# Patient Record
Sex: Male | Born: 1995 | Race: Black or African American | Hispanic: No | Marital: Single | Smoking: Never smoker
Health system: Southern US, Community
[De-identification: ages and names within clinical notes are randomized; demographics above are authoritative.]

## PROBLEM LIST (undated history)

## (undated) DIAGNOSIS — K5900 Constipation, unspecified: Secondary | ICD-10-CM

## (undated) DIAGNOSIS — J45909 Unspecified asthma, uncomplicated: Secondary | ICD-10-CM

## (undated) DIAGNOSIS — L240 Irritant contact dermatitis due to detergents: Secondary | ICD-10-CM

## (undated) DIAGNOSIS — F5001 Anorexia nervosa, restricting type: Secondary | ICD-10-CM

## (undated) DIAGNOSIS — D709 Neutropenia, unspecified: Secondary | ICD-10-CM

## (undated) DIAGNOSIS — T7840XA Allergy, unspecified, initial encounter: Secondary | ICD-10-CM

## (undated) DIAGNOSIS — L309 Dermatitis, unspecified: Secondary | ICD-10-CM

## (undated) DIAGNOSIS — E559 Vitamin D deficiency, unspecified: Secondary | ICD-10-CM

## (undated) DIAGNOSIS — L709 Acne, unspecified: Secondary | ICD-10-CM

## (undated) HISTORY — DX: Allergy, unspecified, initial encounter: T78.40XA

## (undated) HISTORY — PX: TONSILLECTOMY AND ADENOIDECTOMY: SUR1326

## (undated) HISTORY — DX: Vitamin D deficiency, unspecified: E55.9

## (undated) HISTORY — DX: Unspecified asthma, uncomplicated: J45.909

## (undated) HISTORY — DX: Anorexia nervosa, restricting type: F50.01

## (undated) HISTORY — DX: Constipation, unspecified: K59.00

## (undated) HISTORY — DX: Neutropenia, unspecified: D70.9

## (undated) HISTORY — DX: Irritant contact dermatitis due to detergents: L24.0

## (undated) HISTORY — DX: Acne, unspecified: L70.9

## (undated) HISTORY — DX: Dermatitis, unspecified: L30.9

---
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
## 2015-05-25 ENCOUNTER — Telehealth: Payer: Self-pay | Admitting: Family Medicine

## 2015-05-25 NOTE — Telephone Encounter (Signed)
Pt needs refill on hisTriamcinolone 0.5% Cream. Pt uses the [facility] [address]

## 2015-05-26 ENCOUNTER — Other Ambulatory Visit: Payer: Self-pay | Admitting: Family Medicine

## 2015-06-28 ENCOUNTER — Ambulatory Visit (INDEPENDENT_AMBULATORY_CARE_PROVIDER_SITE_OTHER): Payer: Medicaid Other | Admitting: Family Medicine

## 2015-06-28 ENCOUNTER — Encounter: Payer: Self-pay | Admitting: Family Medicine

## 2015-06-28 ENCOUNTER — Other Ambulatory Visit: Payer: Self-pay | Admitting: Family Medicine

## 2015-06-28 VITALS — BP 102/64 | HR 93 | Temp 98.3°F | Resp 14 | Ht 71.0 in | Wt 140.0 lb

## 2015-06-28 DIAGNOSIS — R17 Unspecified jaundice: Secondary | ICD-10-CM

## 2015-06-28 DIAGNOSIS — K5900 Constipation, unspecified: Secondary | ICD-10-CM | POA: Insufficient documentation

## 2015-06-28 DIAGNOSIS — L309 Dermatitis, unspecified: Secondary | ICD-10-CM | POA: Insufficient documentation

## 2015-06-28 DIAGNOSIS — E559 Vitamin D deficiency, unspecified: Secondary | ICD-10-CM | POA: Insufficient documentation

## 2015-06-28 DIAGNOSIS — F5001 Anorexia nervosa, restricting type: Secondary | ICD-10-CM | POA: Insufficient documentation

## 2015-06-28 DIAGNOSIS — J302 Other seasonal allergic rhinitis: Secondary | ICD-10-CM | POA: Insufficient documentation

## 2015-06-28 DIAGNOSIS — H1011 Acute atopic conjunctivitis, right eye: Secondary | ICD-10-CM

## 2015-06-28 DIAGNOSIS — Z91010 Allergy to peanuts: Secondary | ICD-10-CM | POA: Insufficient documentation

## 2015-06-28 DIAGNOSIS — H1010 Acute atopic conjunctivitis, unspecified eye: Secondary | ICD-10-CM | POA: Insufficient documentation

## 2015-06-28 DIAGNOSIS — L239 Allergic contact dermatitis, unspecified cause: Secondary | ICD-10-CM | POA: Insufficient documentation

## 2015-06-28 DIAGNOSIS — L709 Acne, unspecified: Secondary | ICD-10-CM | POA: Insufficient documentation

## 2015-06-28 DIAGNOSIS — J3089 Other allergic rhinitis: Secondary | ICD-10-CM

## 2015-06-28 DIAGNOSIS — J4540 Moderate persistent asthma, uncomplicated: Secondary | ICD-10-CM | POA: Insufficient documentation

## 2015-06-28 DIAGNOSIS — J309 Allergic rhinitis, unspecified: Secondary | ICD-10-CM | POA: Insufficient documentation

## 2015-06-28 NOTE — Progress Notes (Signed)
Name: Ricardo Ramos   MRN: [id number]    DOB: 1996/09/11   Date:06/28/2015       Progress Note  Subjective  Chief Complaint  Chief Complaint  Patient presents with  . Allergic Rhinitis     white part of eye yellow, crusted in the morning onset 1 day    HPI  Patient is here today accompanied by his grandmother with concerns regarding the following symptoms itchy red right eye, swelling, discharge, pain of the right eye. No fevers, vomiting, headaches or visual changes. Started taking his claritin which has significantly improved his symptoms. Although he does not some yellowing of the whites of his eyes. Denies abdominal pain, change to the color of his stools, fevers, chills, headaches, nausea, illicit drug use.    Past Medical History  Diagnosis Date  . Allergy   . Asthma     History  Substance Use Topics  . Smoking status: Never Smoker   . Smokeless tobacco: Not on file  . Alcohol Use: No     Current outpatient prescriptions:  .  albuterol (PROAIR HFA) 108 (90 BASE) MCG/ACT inhaler, Inhale 1-2 puffs into the lungs every 6 (six) hours as needed., Disp: , Rfl:  .  beclomethasone (QVAR) 40 MCG/ACT inhaler, Inhale 2 puffs into the lungs 2 (two) times daily., Disp: , Rfl:  .  EPINEPHrine (EPIPEN 2-PAK) 0.3 mg/0.3 mL IJ SOAJ injection, EPIPEN 2-PAK, 0.3MG /0.3ML (Injection Solution Auto-injector)  1 Device use as directed for 30 days  Quantity: 1;  Refills: 1   Ordered :14-Aug-2014  Alba Cory MD;  Started 14-Aug-2014 Active, Disp: , Rfl:  .  loratadine (CLARITIN) 10 MG tablet, Take 10 mg by mouth daily., Disp: , Rfl:  .  triamcinolone cream (KENALOG) 0.5 %, APPLY TO AFFECTED AREA TWICE A DAY, Disp: 75 g, Rfl: 0 .  Vitamin D, Ergocalciferol, (DRISDOL) 50000 UNITS CAPS capsule, Take 50,000 Units by mouth every 7 (seven) days., Disp: , Rfl:   Allergies  Allergen Reactions  . Advair Diskus  [Fluticasone-Salmeterol]   . Eggs Or Egg-Derived Products   . Fish Allergy   .  Peanut Oil   . Peanut-Containing Drug Products   . Penicillins   . Shellfish Allergy   . Strawberry   . Wheat Bran     ROS  10 Systems reviewed and is negative except as mentioned in HPI.   Objective  Filed Vitals:   06/28/15 1416  BP: 102/64  Pulse: 93  Temp: 98.3 F (36.8 C)  TempSrc: Oral  Resp: 14  Height:  (1.803 m)  Weight: 140 lb (63.504 kg)  SpO2: 98%   Body mass index is 19.53 kg/(m^2).   Physical Exam  Constitutional: Patient appears well-developed and well-nourished. In no distress.  HEENT:  - Head: Normocephalic and atraumatic.  - Ears: Bilateral TMs gray, no erythema or effusion - Nose: Nasal mucosa moist. - Mouth/Throat: Oropharynx is clear and moist. No tonsillar hypertrophy or erythema. No post nasal drainage.  - Eyes: Right and left eye scleral icterus mild with a dull yellow discharge on eyelashes on right side only present. No erythema or corneal congestion. EOM movements normal. PERRLA.  Neck: Normal range of motion. Neck supple. No JVD present. No thyromegaly present.  Cardiovascular: Normal rate, regular rhythm and normal heart sounds.  No murmur heard.  Pulmonary/Chest: Effort normal and breath sounds normal. No respiratory distress. Musculoskeletal: Normal range of motion bilateral UE and LE, no joint effusions. Peripheral vascular: Bilateral LE no edema.  Neurological: CN II-XII grossly intact with no focal deficits. Alert and oriented to person, place, and time. Coordination, balance, strength, speech and gait are normal.  Skin: Skin is warm and dry. No rash noted. No erythema.  Psychiatric: Patient has a stable mood and flat affect. Behavior is normal in office today. Judgment and thought content normal in office today.   Assessment & Plan 1. Allergic rhinitis, seasonal Continue claritin daily.  2. Allergic conjunctivitis and rhinitis, right Nearly resolved, may use warm compressor to wipe away any further discharge. [place] OTC  allergy eye drop recommended.   3. Scleral icterus Slight yellowing of his eyes will get lab work.  - Comprehensive metabolic panel - Hepatitis A antibody, total - Hepatitis B surface antibody - Hepatitis C antibody - HIV antibody

## 2015-06-28 NOTE — Patient Instructions (Signed)
Allergic Conjunctivitis  The conjunctiva is a thin membrane that covers the visible white part of the eyeball and the underside of the eyelids. This membrane protects and lubricates the eye. The membrane has small blood vessels running through it that can normally be seen. When the conjunctiva becomes inflamed, the condition is called conjunctivitis. In response to the inflammation, the conjunctival blood vessels become swollen. The swelling results in redness in the normally white part of the eye.  The blood vessels of this membrane also react when a person has allergies and is then called allergic conjunctivitis. This condition usually lasts for as long as the allergy persists. Allergic conjunctivitis cannot be passed to another person (non-contagious). The likelihood of bacterial infection is great and the cause is not likely due to allergies if the inflamed eye has:  · A sticky discharge.  · Discharge or sticking together of the lids in the morning.  · Scaling or flaking of the eyelids where the eyelashes come out.  · Red swollen eyelids.  CAUSES   · Viruses.  · Irritants such as foreign bodies.  · Chemicals.  · General allergic reactions.  · Inflammation or serious diseases in the inside or the outside of the eye or the orbit (the boney cavity in which the eye sits) can cause a "red eye."  SYMPTOMS   · Eye redness.  · Tearing.  · Itchy eyes.  · Burning feeling in the eyes.  · Clear drainage from the eye.  · Allergic reaction due to pollens or ragweed sensitivity. Seasonal allergic conjunctivitis is frequent in the spring when pollens are in the air and in the fall.  DIAGNOSIS   This condition, in its many forms, is usually diagnosed based on the history and an ophthalmological exam. It usually involves both eyes. If your eyes react at the same time every year, allergies may be the cause. While most "red eyes" are due to allergy or an infection, the role of an eye (ophthalmological) exam is important. The exam  can rule out serious diseases of the eye or orbit.  TREATMENT   · Non-antibiotic eye drops, ointments, or medications by mouth may be prescribed if the ophthalmologist is sure the conjunctivitis is due to allergies alone.  · Over-the-counter drops and ointments for allergic symptoms should be used only after other causes of conjunctivitis have been ruled out, or as your caregiver suggests.  Medications by mouth are often prescribed if other allergy-related symptoms are present. If the ophthalmologist is sure that the conjunctivitis is due to allergies alone, treatment is normally limited to drops or ointments to reduce itching and burning.  HOME CARE INSTRUCTIONS   · Wash hands before and after applying drops or ointments, or touching the inflamed eye(s) or eyelids.  · Do not let the eye dropper tip or ointment tube touch the eyelid when putting medicine in your eye.  · Stop using your soft contact lenses and throw them away. Use a new pair of lenses when recovery is complete. You should run through sterilizing cycles at least three times before use after complete recovery if the old soft contact lenses are to be used. Hard contact lenses should be stopped. They need to be thoroughly sterilized before use after recovery.  · Itching and burning eyes due to allergies is often relieved by using a cool cloth applied to closed eye(s).  SEEK MEDICAL CARE IF:   · Your problems do not go away after two or three days of treatment.  ·   Your lids are sticky (especially in the morning when you wake up) or stick together.  · Discharge develops. Antibiotics may be needed either as drops, ointment, or by mouth.  · You have extreme light sensitivity.  · An oral temperature above 102° F (38.9° C) develops.  · Pain in or around the eye or any other visual symptom develops.  MAKE SURE YOU:   · Understand these instructions.  · Will watch your condition.  · Will get help right away if you are not doing well or get worse.  Document  Released: 02/03/2003 Document Revised: 02/05/2012 Document Reviewed: 12/30/2007  [employer]® Patient Information ©2015 [employer]. This information is not intended to replace advice given to you by your health care provider. Make sure you discuss any questions you have with your health care provider.

## 2015-06-29 LAB — COMPREHENSIVE METABOLIC PANEL
ALBUMIN: 4.8 g/dL (ref 3.5–5.5)
ALK PHOS: 92 IU/L (ref 39–117)
ALT: 9 IU/L (ref 0–44)
AST: 18 IU/L (ref 0–40)
Albumin/Globulin Ratio: 1.8 (ref 1.1–2.5)
BUN/Creatinine Ratio: 10 (ref 8–19)
BUN: 15 mg/dL (ref 6–20)
Bilirubin Total: 4.8 mg/dL — ABNORMAL HIGH (ref 0.0–1.2)
CALCIUM: 9.5 mg/dL (ref 8.7–10.2)
CHLORIDE: 101 mmol/L (ref 97–108)
CO2: 28 mmol/L (ref 18–29)
Creatinine, Ser: 1.5 mg/dL — ABNORMAL HIGH (ref 0.76–1.27)
GFR calc Af Amer: 77 mL/min/{1.73_m2} (ref >59)
GFR, EST NON AFRICAN AMERICAN: 67 mL/min/{1.73_m2} (ref >59)
Globulin, Total: 2.6 g/dL (ref 1.5–4.5)
Glucose: 98 mg/dL (ref 65–99)
POTASSIUM: 4.4 mmol/L (ref 3.5–5.2)
SODIUM: 144 mmol/L (ref 134–144)
TOTAL PROTEIN: 7.4 g/dL (ref 6.0–8.5)

## 2015-06-29 LAB — HIV ANTIBODY (ROUTINE TESTING W REFLEX): HIV SCREEN 4TH GENERATION: NONREACTIVE

## 2015-06-29 LAB — HEPATITIS B SURFACE ANTIBODY,QUALITATIVE: HEP B SURFACE AB, QUAL: REACTIVE

## 2015-06-29 LAB — HEPATITIS A ANTIBODY, TOTAL: Hep A Total Ab: POSITIVE — AB

## 2015-06-29 LAB — HEPATITIS C ANTIBODY: Hep C Virus Ab: <0.1 s/co ratio (ref 0.0–0.9)

## 2015-06-29 NOTE — Progress Notes (Signed)
I will communicate my concerns about this patient with Dr. Carlynn Purl in person.

## 2015-07-06 ENCOUNTER — Ambulatory Visit (INDEPENDENT_AMBULATORY_CARE_PROVIDER_SITE_OTHER): Payer: Medicaid Other | Admitting: Family Medicine

## 2015-07-06 ENCOUNTER — Encounter: Payer: Self-pay | Admitting: Family Medicine

## 2015-07-06 VITALS — BP 118/74 | HR 112 | Temp 98.5°F | Resp 14 | Ht 71.0 in | Wt 144.2 lb

## 2015-07-06 DIAGNOSIS — Z87898 Personal history of other specified conditions: Secondary | ICD-10-CM

## 2015-07-06 DIAGNOSIS — J4520 Mild intermittent asthma, uncomplicated: Secondary | ICD-10-CM | POA: Diagnosis not present

## 2015-07-06 DIAGNOSIS — E559 Vitamin D deficiency, unspecified: Secondary | ICD-10-CM

## 2015-07-06 DIAGNOSIS — F5001 Anorexia nervosa, restricting type: Secondary | ICD-10-CM | POA: Diagnosis not present

## 2015-07-06 DIAGNOSIS — Z8639 Personal history of other endocrine, nutritional and metabolic disease: Secondary | ICD-10-CM | POA: Diagnosis not present

## 2015-07-06 MED ORDER — ALBUTEROL SULFATE HFA 108 (90 BASE) MCG/ACT IN AERS: 1.0000 | INHALATION_SPRAY | Freq: Four times a day (QID) | RESPIRATORY_TRACT | Status: DC | PRN

## 2015-07-06 NOTE — Progress Notes (Signed)
Name: Ricardo Ramos   MRN: [id number]    DOB: 19-Jun-1996   Date:07/06/2015       Progress Note  Subjective  Chief Complaint  Chief Complaint  Patient presents with  . Follow-up    Abnormal Labs-Recheck    HPI  Jaundice: patient was seen last week because his sclera was yellow, he did not have any nausea, vomiting or change in appetite, no acholic stools. He did not take nay new supplements, no recent travelling. He had some labs done and it showed elevation on bilirubin, and mild increase in Creatinine.   Anorexia Nervosa: he has the restrictive type, no longer having therapy, but weight has been stable, he denies use of laxatives, he states he has not been counting calories lately, but weigh has been stable, in the past used to eat 1200 calories daily. He denies exercising.  Asthma: symptoms have been controlled, no SOB, no wheezing, no cough, no nocturnal wakening , he uses Proair prn with URI. No need to take prednisone or visits to ER/urgent care over the past year.   Patient Active Problem List   Diagnosis Date Noted  . Acne 06/28/2015  . Anorexia nervosa, restricting type 06/28/2015  . CN (constipation) 06/28/2015  . Allergic contact dermatitis 06/28/2015  . Dermatitis, eczematoid 06/28/2015  . Asthma, mild intermittent 06/28/2015  . Allergic to peanuts 06/28/2015  . Allergic rhinitis, seasonal 06/28/2015  . Vitamin D deficiency 06/28/2015  . Allergic conjunctivitis and rhinitis 06/28/2015    Past Surgical History  Procedure Laterality Date  . Tonsillectomy and adenoidectomy      Family History  Problem Relation Age of Onset  . Asthma Sister   . Allergies Sister     Nuts    History   Social History  . Marital Status: Single    Spouse Name: N/A  . Number of Children: N/A  . Years of Education: N/A   Occupational History  . Not on file.   Social History Main Topics  . Smoking status: Never Smoker   . Smokeless tobacco: Never Used  . Alcohol Use: No   . Drug Use: No  . Sexual Activity: Not Currently   Other Topics Concern  . Not on file   Social History Narrative     Current outpatient prescriptions:  .  albuterol (PROAIR HFA) 108 (90 BASE) MCG/ACT inhaler, Inhale 1-2 puffs into the lungs every 6 (six) hours as needed., Disp: 1 Inhaler, Rfl: 0 .  beclomethasone (QVAR) 40 MCG/ACT inhaler, Inhale 2 puffs into the lungs 2 (two) times daily., Disp: , Rfl:  .  EPINEPHrine (EPIPEN 2-PAK) 0.3 mg/0.3 mL IJ SOAJ injection, EPIPEN 2-PAK, 0.3MG /0.3ML (Injection Solution Auto-injector)  1 Device use as directed for 30 days  Quantity: 1;  Refills: 1   Ordered :14-Aug-2014  Alba Cory MD;  Started 14-Aug-2014 Active, Disp: , Rfl:  .  loratadine (CLARITIN) 10 MG tablet, Take 10 mg by mouth daily., Disp: , Rfl:  .  triamcinolone cream (KENALOG) 0.5 %, APPLY TO AFFECTED AREA TWICE A DAY, Disp: 75 g, Rfl: 0 .  Vitamin D, Ergocalciferol, (DRISDOL) 50000 UNITS CAPS capsule, Take 50,000 Units by mouth every 7 (seven) days., Disp: , Rfl:   Allergies  Allergen Reactions  . Advair Diskus  [Fluticasone-Salmeterol]   . Eggs Or Egg-Derived Products   . Fish Allergy   . Peanut Oil   . Peanut-Containing Drug Products   . Penicillins   . Shellfish Allergy   . Strawberry   .  Wheat Bran      ROS  Constitutional: Negative for fever or weight change.  Respiratory: Negative for cough and shortness of breath.   Cardiovascular: Negative for chest pain or palpitations.  Gastrointestinal: Negative for abdominal pain, no bowel changes.  Musculoskeletal: Negative for gait problem or joint swelling.  Skin: Negative for rash.  Neurological: Negative for dizziness or headache.  No other specific complaints in a complete review of systems (except as listed in HPI above).  Objective  Filed Vitals:   07/06/15 1350  BP: 118/74  Pulse: 112  Temp: 98.5 F (36.9 C)  TempSrc: Oral  Resp: 14  Height: 5\' 11"  (1.803 m)  Weight: 144 lb 3.2 oz (65.409 kg)   SpO2: 97%    Body mass index is 20.12 kg/(m^2).  Physical Exam  Constitutional: Patient appears well-developed and well-nourished.  No distress.  HEENT: head atraumatic, normocephalic, pupils equal and reactive to light, mild icterus conjunctiva , neck supple, throat within normal limits Cardiovascular: Normal rate, regular rhythm and normal heart sounds.  No murmur heard. No BLE edema. Pulmonary/Chest: Effort normal and breath sounds normal. No respiratory distress. Abdominal:   There is no tenderness. Abdominal muscles are too strong and difficulty feeling liver Psychiatric: Patient has a normal mood and affect. behavior is normal. Judgment and thought content normal. Skin: striae on antecubital areas, and has some eczematous patches on hand, wrist and right antecubital area  Recent Results (from the past 2160 hour(s))  Comprehensive metabolic panel     Status: Abnormal   Collection Time: 06/28/15  2:56 PM  Result Value Ref Range   Glucose 98 65 - 99 mg/dL   BUN 15 6 - 20 mg/dL   Creatinine, Ser 4.09 (H) 0.76 - 1.27 mg/dL   GFR calc non Af Amer 67 >59 mL/min/1.73   GFR calc Af Amer 77 >59 mL/min/1.73   BUN/Creatinine Ratio 10 8 - 19   Sodium 144 134 - 144 mmol/L   Potassium 4.4 3.5 - 5.2 mmol/L   Chloride 101 97 - 108 mmol/L   CO2 28 18 - 29 mmol/L   Calcium 9.5 8.7 - 10.2 mg/dL   Total Protein 7.4 6.0 - 8.5 g/dL   Albumin 4.8 3.5 - 5.5 g/dL   Globulin, Total 2.6 1.5 - 4.5 g/dL   Albumin/Globulin Ratio 1.8 1.1 - 2.5   Bilirubin Total 4.8 (H) 0.0 - 1.2 mg/dL    Comment: Note: Specimen is icteric.   Alkaline Phosphatase 92 39 - 117 IU/L   AST 18 0 - 40 IU/L   ALT 9 0 - 44 IU/L  Hepatitis A antibody, total     Status: Abnormal   Collection Time: 06/28/15  2:56 PM  Result Value Ref Range   Hep A Total Ab Positive (A) Negative  Hepatitis B surface antibody     Status: None   Collection Time: 06/28/15  2:56 PM  Result Value Ref Range   Hep B Surface Ab, Qual Reactive      Comment:               Non Reactive: Inconsistent with immunity,                             less than 10 mIU/mL               Reactive:     Consistent with immunity,  greater than 9.9 mIU/mL   Hepatitis C antibody     Status: None   Collection Time: 06/28/15  2:56 PM  Result Value Ref Range   Hep C Virus Ab <0.1 0.0 - 0.9 s/co ratio    Comment:                                   Negative:     < 0.8                              Indeterminate: 0.8 - 0.9                                   Positive:     > 0.9  The CDC recommends that a positive HCV antibody result  be followed up with a HCV Nucleic Acid Amplification  test (191478).   HIV antibody     Status: None   Collection Time: 06/28/15  2:56 PM  Result Value Ref Range   HIV Screen 4th Generation wRfx Non Reactive Non Reactive     PHQ2/9: Depression screen PHQ 2/9 06/28/2015  Decreased Interest 0  Down, Depressed, Hopeless 0  PHQ - 2 Score 0     Fall Risk: Fall Risk  06/28/2015  Falls in the past year? No     Assessment & Plan  1. H/O jaundice Recheck labs, add CBC, may have Gilbert's disease - Comprehensive Metabolic Panel (CMET) - CBC with Differential -G6PD test 2. Anorexia nervosa, restricting type Explained importance of not skipping meals, balance diet and the need to resume therapy if it progresses  - Comprehensive Metabolic Panel (CMET) - CBC with Differential  3. Vitamin D deficiency  - Vitamin D (25 hydroxy)  4. Asthma, mild intermittent, uncomplicated Doing well, continue prn medication  - albuterol (PROAIR HFA) 108 (90 BASE) MCG/ACT inhaler; Inhale 1-2 puffs into the lungs every 6 (six) hours as needed.  Dispense: 1 Inhaler; Refill: 0

## 2015-07-07 ENCOUNTER — Telehealth: Payer: Self-pay

## 2015-07-07 ENCOUNTER — Telehealth: Payer: Self-pay | Admitting: Family Medicine

## 2015-07-07 LAB — COMPREHENSIVE METABOLIC PANEL
A/G RATIO: 2 (ref 1.1–2.5)
ALT: 13 IU/L (ref 0–44)
AST: 17 IU/L (ref 0–40)
Albumin: 4.8 g/dL (ref 3.5–5.5)
Alkaline Phosphatase: 81 IU/L (ref 39–117)
BILIRUBIN TOTAL: 2 mg/dL — AB (ref 0.0–1.2)
BUN/Creatinine Ratio: 10 (ref 8–19)
BUN: 14 mg/dL (ref 6–20)
CALCIUM: 9.8 mg/dL (ref 8.7–10.2)
CO2: 27 mmol/L (ref 18–29)
Chloride: 101 mmol/L (ref 97–108)
Creatinine, Ser: 1.37 mg/dL — ABNORMAL HIGH (ref 0.76–1.27)
GFR calc Af Amer: 86 mL/min/{1.73_m2} (ref >59)
GFR, EST NON AFRICAN AMERICAN: 74 mL/min/{1.73_m2} (ref >59)
GLOBULIN, TOTAL: 2.4 g/dL (ref 1.5–4.5)
Glucose: 83 mg/dL (ref 65–99)
Potassium: 4 mmol/L (ref 3.5–5.2)
SODIUM: 143 mmol/L (ref 134–144)
TOTAL PROTEIN: 7.2 g/dL (ref 6.0–8.5)

## 2015-07-07 LAB — CBC WITH DIFFERENTIAL/PLATELET
BASOS: 1 %
Basophils Absolute: 0 10*3/uL (ref 0.0–0.2)
EOS (ABSOLUTE): 0.1 10*3/uL (ref 0.0–0.4)
Eos: 3 %
Hematocrit: 46.3 % (ref 37.5–51.0)
Hemoglobin: 15.9 g/dL (ref 12.6–17.7)
IMMATURE GRANULOCYTES: 0 %
Immature Grans (Abs): 0 10*3/uL (ref 0.0–0.1)
Lymphocytes Absolute: 1.8 10*3/uL (ref 0.7–3.1)
Lymphs: 50 %
MCH: 29.4 pg (ref 26.6–33.0)
MCHC: 34.3 g/dL (ref 31.5–35.7)
MCV: 86 fL (ref 79–97)
MONOCYTES: 6 %
MONOS ABS: 0.2 10*3/uL (ref 0.1–0.9)
NEUTROS ABS: 1.4 10*3/uL (ref 1.4–7.0)
Neutrophils: 40 %
Platelets: 190 10*3/uL (ref 150–379)
RBC: 5.4 x10E6/uL (ref 4.14–5.80)
RDW: 13.7 % (ref 12.3–15.4)
WBC: 3.6 10*3/uL (ref 3.4–10.8)

## 2015-07-07 LAB — VITAMIN D 25 HYDROXY (VIT D DEFICIENCY, FRACTURES): Vit D, 25-Hydroxy: 21.7 ng/mL — ABNORMAL LOW (ref 30.0–100.0)

## 2015-07-07 NOTE — Telephone Encounter (Signed)
Called back to notify of normal test results

## 2015-07-07 NOTE — Telephone Encounter (Signed)
Left message for patient to return my call for labs 

## 2015-07-07 NOTE — Telephone Encounter (Signed)
RETURNING YOU CALL. THINKS IT IS ABOUT LAB RESULTS

## 2015-07-07 NOTE — Telephone Encounter (Signed)
-----   Message from Alba Cory, MD sent at 07/07/2015 12:28 PM EDT ----- Kidney and bilirubin levels within normal limits Normal CBC Vitamin D is okay, should take 2000units otc daily G6PD level is pending Please notify patient, thank you

## 2015-07-08 LAB — GLUCOSE 6 PHOSPHATE DEHYDROGENASE: G-6-PD, Quant: 8.8 U/g{Hb} (ref 4.6–13.5)

## 2015-07-08 NOTE — Progress Notes (Signed)
Patient notified

## 2015-11-15 ENCOUNTER — Emergency Department
Admission: EM | Admit: 2015-11-15 | Discharge: 2015-11-15 | Disposition: A | Discharge status: Home / Self Care | Payer: Medicaid Other | Attending: Emergency Medicine | Admitting: Emergency Medicine

## 2015-11-15 ENCOUNTER — Encounter: Payer: Self-pay | Admitting: Emergency Medicine

## 2015-11-15 DIAGNOSIS — R0600 Dyspnea, unspecified: Secondary | ICD-10-CM | POA: Diagnosis present

## 2015-11-15 DIAGNOSIS — Z79899 Other long term (current) drug therapy: Secondary | ICD-10-CM | POA: Insufficient documentation

## 2015-11-15 DIAGNOSIS — J45901 Unspecified asthma with (acute) exacerbation: Secondary | ICD-10-CM | POA: Insufficient documentation

## 2015-11-15 DIAGNOSIS — Z7951 Long term (current) use of inhaled steroids: Secondary | ICD-10-CM | POA: Diagnosis not present

## 2015-11-15 DIAGNOSIS — Z880 Allergy status to penicillin: Secondary | ICD-10-CM | POA: Diagnosis not present

## 2015-11-15 DIAGNOSIS — Z7952 Long term (current) use of systemic steroids: Secondary | ICD-10-CM | POA: Diagnosis not present

## 2015-11-15 MED ORDER — ALBUTEROL SULFATE (2.5 MG/3ML) 0.083% IN NEBU: 2.5000 mg | INHALATION_SOLUTION | Freq: Four times a day (QID) | RESPIRATORY_TRACT | Status: AC | PRN

## 2015-11-15 NOTE — ED Notes (Signed)
Pt in via EMS w/ complaints of soreness in chest, wheezing, shortness of breath.  Pt used inhaler but no relief; grandmother called EMS at this time.  Pt w/ hx of asthma.  Pt A/Ox4, vitals WDL.  No immediate distress at this time.  Grandmother at bedside.

## 2015-11-15 NOTE — Discharge Instructions (Signed)
Asthma, Adult Asthma is a condition of the lungs in which the airways tighten and narrow. Asthma can make it hard to breathe. Asthma cannot be cured, but medicine and lifestyle changes can help control it. Asthma may be started (triggered) by:  Animal skin flakes (dander).  Dust.  Cockroaches.  Pollen.  Mold.  Smoke.  Cleaning products.  Hair sprays or aerosol sprays.  Paint fumes or strong smells.  Cold air, weather changes, and winds.  Crying or laughing hard.  Stress.  Certain medicines or drugs.  Foods, such as dried fruit, potato chips, and sparkling grape juice.  Infections or conditions (colds, flu).  Exercise.  Certain medical conditions or diseases.  Exercise or tiring activities. HOME CARE   Take medicine as told by your doctor.  Use a peak flow meter as told by your doctor. A peak flow meter is a tool that measures how well the lungs are working.  Record and keep track of the peak flow meter's readings.  Understand and use the asthma action plan. An asthma action plan is a written plan for taking care of your asthma and treating your attacks.  To help prevent asthma attacks:  Do not smoke. Stay away from secondhand smoke.  Change your heating and air conditioning filter often.  Limit your use of fireplaces and wood stoves.  Get rid of pests (such as roaches and mice) and their droppings.  Throw away plants if you see mold on them.  Clean your floors. Dust regularly. Use cleaning products that do not smell.  Have someone vacuum when you are not home. Use a vacuum cleaner with a HEPA filter if possible.  Replace carpet with wood, tile, or vinyl flooring. Carpet can trap animal skin flakes and dust.  Use allergy-proof pillows, mattress covers, and box spring covers.  Wash bed sheets and blankets every week in hot water and dry them in a dryer.  Use blankets that are made of polyester or cotton.  Clean bathrooms and kitchens with bleach.  If possible, have someone repaint the walls in these rooms with mold-resistant paint. Keep out of the rooms that are being cleaned and painted.  Wash hands often. GET HELP IF:  You have make a whistling sound when breaking (wheeze), have shortness of breath, or have a cough even if taking medicine to prevent attacks.  The colored mucus you cough up (sputum) is thicker than usual.  The colored mucus you cough up changes from clear or white to yellow, green, gray, or bloody.  You have problems from the medicine you are taking such as:  A rash.  Itching.  Swelling.  Trouble breathing.  You need reliever medicines more than 2-3 times a week.  Your peak flow measurement is still at 50-79% of your personal best after following the action plan for 1 hour.  You have a fever. GET HELP RIGHT AWAY IF:   You seem to be worse and are not responding to medicine during an asthma attack.  You are short of breath even at rest.  You get short of breath when doing very little activity.  You have trouble eating, drinking, or talking.  You have chest pain.  You have a fast heartbeat.  Your lips or fingernails start to turn blue.  You are light-headed, dizzy, or faint.  Your peak flow is less than 50% of your personal best.   This information is not intended to replace advice given to you by your health care provider. Make sure   you discuss any questions you have with your health care provider.   Document Released: 05/01/2008 Document Revised: 08/04/2015 Document Reviewed: 06/12/2013 [employer] Interactive Patient Education 2016 [employer]  

## 2015-11-15 NOTE — ED Provider Notes (Signed)
[facility] Emergency Department Provider Note  ____________________________________________  Time seen: Approximately 8:37 PM  I have reviewed the triage vital signs and the nursing notes.   HISTORY  Chief Complaint Asthma    HPI Ricardo Ramos is a 19 y.o. male patient arrived via EMS secondary to dyspnea and wheezing. Patient has a history of asthma but has not had attack in over 2 years. Patient stated he uses an inhaler but got no relief in his grandmother called EMS and patient transferred to ER. Patient was given a nebulized treatment in route which resolved his wheezing. Patient grandmother states that he has a home nebulizer machine but no medication. Patient states the initial pain discomfort as a 5 over 5 was completely resolved at this time.  Past Medical History  Diagnosis Date  . Allergy   . Asthma   . Constipation   . Eczema   . Acne     [facility]  . Vitamin D deficiency   . Neutropenia (HCC)   . Anorexia nervosa, restricting type   . Contact dermatitis due to detergents     Patient Active Problem List   Diagnosis Date Noted  . Acne 06/28/2015  . Anorexia nervosa, restricting type 06/28/2015  . CN (constipation) 06/28/2015  . Allergic contact dermatitis 06/28/2015  . Dermatitis, eczematoid 06/28/2015  . Asthma, mild intermittent 06/28/2015  . Allergic to peanuts 06/28/2015  . Allergic rhinitis, seasonal 06/28/2015  . Vitamin D deficiency 06/28/2015  . Allergic conjunctivitis and rhinitis 06/28/2015    Past Surgical History  Procedure Laterality Date  . Tonsillectomy and adenoidectomy      Current Outpatient Rx  Name  Route  Sig  Dispense  Refill  . albuterol (PROAIR HFA) 108 (90 BASE) MCG/ACT inhaler   Inhalation   Inhale 1-2 puffs into the lungs every 6 (six) hours as needed.   1 Inhaler   0   . albuterol (PROVENTIL) (2.5 MG/3ML) 0.083% nebulizer solution   Nebulization   Take 3 mLs (2.5 mg total) by  nebulization every 6 (six) hours as needed for wheezing or shortness of breath.   75 mL   0   . beclomethasone (QVAR) 40 MCG/ACT inhaler   Inhalation   Inhale 2 puffs into the lungs 2 (two) times daily.         Marland Kitchen EPINEPHrine (EPIPEN 2-PAK) 0.3 mg/0.3 mL IJ SOAJ injection      EPIPEN 2-PAK, 0.3MG /0.3ML (Injection Solution Auto-injector)  1 Device use as directed for 30 days  Quantity: 1;  Refills: 1   Ordered :14-Aug-2014  Alba Cory MD;  Started 14-Aug-2014 Active         . loratadine (CLARITIN) 10 MG tablet   Oral   Take 10 mg by mouth daily.         Marland Kitchen triamcinolone cream (KENALOG) 0.5 %      APPLY TO AFFECTED AREA TWICE A DAY   75 g   0   . Vitamin D, Ergocalciferol, (DRISDOL) 50000 UNITS CAPS capsule   Oral   Take 50,000 Units by mouth every 7 (seven) days.           Allergies Advair diskus ; Eggs or egg-derived products; Fish allergy; Peanut oil; Peanut-containing drug products; Penicillins; Shellfish allergy; Strawberry extract; and Wheat bran  Family History  Problem Relation Age of Onset  . Asthma Sister   . Allergies Sister     Nuts    Social History Social History  Substance Use  Topics  . Smoking status: Never Smoker   . Smokeless tobacco: Never Used  . Alcohol Use: No    Review of Systems Constitutional: No fever/chills Eyes: No visual changes. ENT: No sore throat. Cardiovascular: Denies chest pain. Respiratory: Wheezing or shortness of breath Gastrointestinal: No abdominal pain.  No nausea, no vomiting.  No diarrhea.  No constipation. Genitourinary: Negative for dysuria. Musculoskeletal: Negative for back pain. Skin: Negative for rash. Neurological: Negative for headaches, focal weakness or numbness. Hematological/Lymphatic: Allergic/Immunilogical: See medication list  10-point ROS otherwise negative.  ____________________________________________   PHYSICAL EXAM:  VITAL SIGNS: ED Triage Vitals  Enc Vitals Group     BP  11/15/15 2015 128/85 mmHg     Pulse Rate 11/15/15 2015 93     Resp --      Temp --      Temp src --      SpO2 11/15/15 2010 98 %     Weight 11/15/15 2015 140 lb (63.504 kg)     Height 11/15/15 2015 5\' 11"  (1.803 m)     Head Cir --      Peak Flow --      Pain Score 11/15/15 2017 5     Pain Loc --      Pain Edu? --      Excl. in GC? --     Constitutional: Alert and oriented. Well appearing and in no acute distress. Eyes: Conjunctivae are normal. PERRL. EOMI. Head: Atraumatic. Nose: No congestion/rhinnorhea. Mouth/Throat: Mucous membranes are moist.  Oropharynx non-erythematous. Neck: No stridor.  No cervical spine tenderness to palpation. Hematological/Lymphatic/Immunilogical: No cervical lymphadenopathy. Cardiovascular: Normal rate, regular rhythm. Grossly normal heart sounds.  Good peripheral circulation. Respiratory: Normal respiratory effort.  No retractions. Lungs CTAB. Gastrointestinal: Soft and nontender. No distention. No abdominal bruits. No CVA tenderness. Musculoskeletal: No lower extremity tenderness nor edema.  No joint effusions. Neurologic:  Normal speech and language. No gross focal neurologic deficits are appreciated. No gait instability. Skin:  Skin is warm, dry and intact. No rash noted. Psychiatric: Mood and affect are normal. Speech and behavior are normal.  ____________________________________________   LABS (all labs ordered are listed, but only abnormal results are displayed)  Labs Reviewed - No data to display ____________________________________________  EKG   ____________________________________________  RADIOLOGY   ____________________________________________   PROCEDURES  Procedure(s) performed: None  Critical Care performed: No  ____________________________________________   INITIAL IMPRESSION / ASSESSMENT AND PLAN / ED COURSE  Pertinent labs & imaging results that were available during my care of the patient were reviewed by me  and considered in my medical decision making (see chart for details).  Asthma attack. Patient complaining has resolved secondary to having a nebulized treatment in route. Patient was given a prescription for albuterol to use a nebulizer machine. Patient advised to follow-up with family doctor. ____________________________________________   FINAL CLINICAL IMPRESSION(S) / ED DIAGNOSES  Final diagnoses:  Asthma attack      Joni ReiningRonald K Smith, PA-C 11/15/15 2044  Sharyn CreamerMark Quale, MD 11/15/15 [phone]

## 2015-11-16 ENCOUNTER — Telehealth: Payer: Self-pay

## 2015-11-16 ENCOUNTER — Encounter: Payer: Self-pay | Admitting: Family Medicine

## 2015-11-16 ENCOUNTER — Ambulatory Visit (INDEPENDENT_AMBULATORY_CARE_PROVIDER_SITE_OTHER): Payer: Medicaid Other | Admitting: Family Medicine

## 2015-11-16 VITALS — BP 94/62 | HR 102 | Temp 98.1°F | Resp 14 | Ht 71.0 in | Wt 147.2 lb

## 2015-11-16 DIAGNOSIS — Z23 Encounter for immunization: Secondary | ICD-10-CM

## 2015-11-16 DIAGNOSIS — J4521 Mild intermittent asthma with (acute) exacerbation: Secondary | ICD-10-CM | POA: Diagnosis not present

## 2015-11-16 MED ORDER — NEBULIZER AIR TUBE/PLUGS MISC: 1.0000 | Freq: Every day | Status: AC

## 2015-11-16 MED ORDER — BUDESONIDE 0.5 MG/2ML IN SUSP: 0.5000 mg | Freq: Two times a day (BID) | RESPIRATORY_TRACT | Status: DC

## 2015-11-16 NOTE — Progress Notes (Signed)
Name: Ricardo Ramos   MRN: [id number]    DOB: 1996/03/22   Date:11/16/2015       Progress Note  Subjective  Chief Complaint  Chief Complaint  Patient presents with  . Follow-up    from ER last night for asthma attack syptoms include: SOB, wheezing,cough.  Was given nebulizer treatments and steriod injection.  Better since last night    HPI  Asthma Mild with acute exacerbation: he was doing well however last week had some cold symptoms: rhinorrhea, scratchy throat, nasal congestion.  He resumed his allergy medications and symptoms improved, but yesterday afternoon  he developed chest tightness, wheezing and a dry cough. Grandmother called 911 , he was given neb therapy and a shot inside by EMS and he started to feel better. He was seen at [facility] and by the time he got there he was feeling much better and was sent home with a prescription for albuterol for nebulizer machine.    Patient Active Problem List   Diagnosis Date Noted  . Acne 06/28/2015  . Anorexia nervosa, restricting type 06/28/2015  . CN (constipation) 06/28/2015  . Allergic contact dermatitis 06/28/2015  . Dermatitis, eczematoid 06/28/2015  . Asthma, mild intermittent 06/28/2015  . Allergic to peanuts 06/28/2015  . Allergic rhinitis, seasonal 06/28/2015  . Vitamin D deficiency 06/28/2015  . Allergic conjunctivitis and rhinitis 06/28/2015    Past Surgical History  Procedure Laterality Date  . Tonsillectomy and adenoidectomy      Family History  Problem Relation Age of Onset  . Asthma Sister   . Allergies Sister     Nuts    Social History   Social History  . Marital Status: Single    Spouse Name: N/A  . Number of Children: N/A  . Years of Education: N/A   Occupational History  . Not on file.   Social History Main Topics  . Smoking status: Never Smoker   . Smokeless tobacco: Never Used  . Alcohol Use: No  . Drug Use: No  . Sexual Activity: Not Currently   Other Topics Concern  . Not on file    Social History Narrative     Current outpatient prescriptions:  .  albuterol (PROAIR HFA) 108 (90 BASE) MCG/ACT inhaler, Inhale 1-2 puffs into the lungs every 6 (six) hours as needed., Disp: 1 Inhaler, Rfl: 0 .  albuterol (PROVENTIL) (2.5 MG/3ML) 0.083% nebulizer solution, Take 3 mLs (2.5 mg total) by nebulization every 6 (six) hours as needed for wheezing or shortness of breath., Disp: 75 mL, Rfl: 0 .  beclomethasone (QVAR) 40 MCG/ACT inhaler, Inhale 2 puffs into the lungs 2 (two) times daily., Disp: , Rfl:  .  budesonide (PULMICORT) 0.5 MG/2ML nebulizer solution, Take 2 mLs (0.5 mg total) by nebulization 2 (two) times daily., Disp: 60 mL, Rfl: 1 .  EPINEPHrine (EPIPEN 2-PAK) 0.3 mg/0.3 mL IJ SOAJ injection, EPIPEN 2-PAK, 0.3MG /0.3ML (Injection Solution Auto-injector)  1 Device use as directed for 30 days  Quantity: 1;  Refills: 1   Ordered :14-Aug-2014  Alba Cory MD;  Started 14-Aug-2014 Active, Disp: , Rfl:  .  loratadine (CLARITIN) 10 MG tablet, Take 10 mg by mouth daily., Disp: , Rfl:  .  Respiratory Therapy Supplies (NEBULIZER AIR TUBE/PLUGS) MISC, 1 each by Does not apply route daily., Disp: 1 each, Rfl: 2 .  triamcinolone cream (KENALOG) 0.5 %, APPLY TO AFFECTED AREA TWICE A DAY, Disp: 75 g, Rfl: 0 .  Vitamin D, Ergocalciferol, (DRISDOL) 50000 UNITS CAPS capsule, Take  50,000 Units by mouth every 7 (seven) days., Disp: , Rfl:   Allergies  Allergen Reactions  . Advair Diskus  [Fluticasone-Salmeterol]   . Eggs Or Egg-Derived Products   . Fish Allergy   . Peanut Oil   . Peanut-Containing Drug Products   . Penicillins   . Shellfish Allergy   . Strawberry Extract   . Wheat Bran      ROS  Ten systems reviewed and is negative except as mentioned in HPI   Objective  Filed Vitals:   11/16/15 1029  BP: 94/62  Pulse: 102  Temp: 98.1 F (36.7 C)  TempSrc: Oral  Resp: 14  Height: 5\' 11"  (1.803 m)  Weight: 147 lb 3.2 oz (66.769 kg)  SpO2: 99%    Body mass index is  20.54 kg/(m^2).  Physical Exam  Constitutional: Patient appears well-developed and well-nourished.No distress.  HEENT: head atraumatic, normocephalic, pupils equal and reactive to light, ears TM, neck supple, throat within normal limits Cardiovascular: Normal rate, regular rhythm and normal heart sounds.  No murmur heard. No BLE edema. Pulmonary/Chest: Effort normal and breath sounds normal. No respiratory distress. Abdominal: Soft.  There is no tenderness. Psychiatric: Patient has a normal mood and affect. behavior is normal. Judgment and thought content normal.   PHQ2/9: Depression screen PHQ 2/9 06/28/2015  Decreased Interest 0  Down, Depressed, Hopeless 0  PHQ - 2 Score 0     Fall Risk: Fall Risk  06/28/2015  Falls in the past year? No     Assessment & Plan  1. Asthma, mild intermittent, with acute exacerbation  Advised to use Pulmicort twice daily for the next week and wean self off - budesonide (PULMICORT) 0.5 MG/2ML nebulizer solution; Take 2 mLs (0.5 mg total) by nebulization 2 (two) times daily.  Dispense: 60 mL; Refill: 1 - Respiratory Therapy Supplies (NEBULIZER AIR TUBE/PLUGS) MISC; 1 each by Does not apply route daily.  Dispense: 1 each; Refill: 2  2. Needs flu shot  - Flu Vaccine QUAD 36+ mos PF IM (Fluarix & Fluzone Quad PF) - refused and is allergic to eggs

## 2015-11-16 NOTE — Telephone Encounter (Signed)
[facility] does not carry or have in stock Nebulizer and equipment supplies.

## 2015-11-18 ENCOUNTER — Ambulatory Visit: Payer: Medicaid Other | Admitting: Family Medicine

## 2015-12-17 ENCOUNTER — Encounter: Payer: Self-pay | Admitting: Family Medicine

## 2015-12-17 ENCOUNTER — Ambulatory Visit (INDEPENDENT_AMBULATORY_CARE_PROVIDER_SITE_OTHER): Payer: Medicaid Other | Admitting: Family Medicine

## 2015-12-17 VITALS — BP 110/62 | HR 70 | Temp 98.1°F | Resp 16 | Ht 71.0 in | Wt 150.2 lb

## 2015-12-17 DIAGNOSIS — J4540 Moderate persistent asthma, uncomplicated: Secondary | ICD-10-CM | POA: Diagnosis not present

## 2015-12-17 DIAGNOSIS — F5001 Anorexia nervosa, restricting type: Secondary | ICD-10-CM | POA: Diagnosis not present

## 2015-12-17 DIAGNOSIS — L309 Dermatitis, unspecified: Secondary | ICD-10-CM | POA: Diagnosis not present

## 2015-12-17 MED ORDER — ALBUTEROL SULFATE (2.5 MG/3ML) 0.083% IN NEBU
2.5000 mg | INHALATION_SOLUTION | Freq: Once | RESPIRATORY_TRACT | Status: AC
  Administered 2015-12-17: 2.5 mg via RESPIRATORY_TRACT

## 2015-12-17 MED ORDER — BECLOMETHASONE DIPROPIONATE 40 MCG/ACT IN AERS: 2.0000 | INHALATION_SPRAY | Freq: Two times a day (BID) | RESPIRATORY_TRACT | Status: DC

## 2015-12-17 MED ORDER — ALBUTEROL SULFATE HFA 108 (90 BASE) MCG/ACT IN AERS: 1.0000 | INHALATION_SPRAY | Freq: Four times a day (QID) | RESPIRATORY_TRACT | Status: DC | PRN

## 2015-12-17 MED ORDER — FLUTICASONE FUROATE-VILANTEROL 100-25 MCG/INH IN AEPB: 1.0000 | INHALATION_SPRAY | Freq: Every day | RESPIRATORY_TRACT | Status: DC

## 2015-12-17 MED ORDER — TRIAMCINOLONE ACETONIDE 0.5 % EX CREA: TOPICAL_CREAM | Freq: Two times a day (BID) | CUTANEOUS | Status: DC

## 2015-12-17 NOTE — Progress Notes (Signed)
Name: Ricardo Ramos   MRN: [id number]    DOB: 1996/03/22   Date:12/17/2015       Progress Note  Subjective  Chief Complaint  Chief Complaint  Patient presents with  . Follow-up    1 month F/u  . Asthma    Breathing has improved with inhalers    HPI  Asthma Moderate Persistent: he was hospitalized back in December with an acute onset of SOB and wheezing. He has been using Qvar twice daily and cough has resolved, there is no wheezing, or SOB. He states he feels fine. [facility] because there was such an improvement on Spirometry after albuterol therapy, but since he did not noticed an improvement on the way he felt, and there is a questionable history of Advair allergy, we will continue Qvar and prn Proair.   Jaundice: patient was seen last Summer because his sclera was yellow, he did not have any nausea, vomiting or change in appetite, no acholic stools. He did not take nay new supplements, no recent travelling. He had some labs done and it showed elevation on bilirubin, and mild increase in Creatinine, repeat labs had improved, and G6 Pd test was negative, no more symptoms of jaundice. He does not want to have repeat labs at this time  Anorexia Nervosa: he has the restrictive type, no longer having therapy, but weight has been stable- and he gained a few lbs since last month.  He denies use of laxatives, he states he has not been counting calories lately,  in the past used to eat 1200 calories daily. He is no longer exercising.   Eczema: he needs a refill Triamcinolone, symptoms are intermittent and he only uses it prn. Usually on popliteal fossa now. No rashes at this time  Patient Active Problem List   Diagnosis Date Noted  . Acne 06/28/2015  . Anorexia nervosa, restricting type 06/28/2015  . CN (constipation) 06/28/2015  . Allergic contact dermatitis 06/28/2015  . Dermatitis, eczematoid 06/28/2015  . Asthma, moderate persistent 06/28/2015  . Allergic to  peanuts 06/28/2015  . Allergic rhinitis, seasonal 06/28/2015  . Vitamin D deficiency 06/28/2015  . Allergic conjunctivitis and rhinitis 06/28/2015    Past Surgical History  Procedure Laterality Date  . Tonsillectomy and adenoidectomy      Family History  Problem Relation Age of Onset  . Asthma Sister   . Allergies Sister     Nuts    Social History   Social History  . Marital Status: Single    Spouse Name: N/A  . Number of Children: N/A  . Years of Education: N/A   Occupational History  . Not on file.   Social History Main Topics  . Smoking status: Never Smoker   . Smokeless tobacco: Never Used  . Alcohol Use: No  . Drug Use: No  . Sexual Activity: Not Currently   Other Topics Concern  . Not on file   Social History Narrative     Current outpatient prescriptions:  .  albuterol (PROAIR HFA) 108 (90 Base) MCG/ACT inhaler, Inhale 1-2 puffs into the lungs every 6 (six) hours as needed., Disp: 1 Inhaler, Rfl: 0 .  albuterol (PROVENTIL) (2.5 MG/3ML) 0.083% nebulizer solution, Take 3 mLs (2.5 mg total) by nebulization every 6 (six) hours as needed for wheezing or shortness of breath., Disp: 75 mL, Rfl: 0 .  EPINEPHrine (EPIPEN 2-PAK) 0.3 mg/0.3 mL IJ SOAJ injection, EPIPEN 2-PAK, 0.3MG /0.3ML (Injection Solution Auto-injector)  1 Device use as  directed for 30 days  Quantity: 1;  Refills: 1   Ordered :14-Aug-2014  Alba Cory MD;  Started 14-Aug-2014 Active, Disp: , Rfl:  .  loratadine (CLARITIN) 10 MG tablet, Take 10 mg by mouth daily., Disp: , Rfl:  .  Respiratory Therapy Supplies (NEBULIZER AIR TUBE/PLUGS) MISC, 1 each by Does not apply route daily., Disp: 1 each, Rfl: 2 .  triamcinolone cream (KENALOG) 0.5 %, APPLY TO AFFECTED AREA TWICE A DAY, Disp: 75 g, Rfl: 0 .  Vitamin D, Ergocalciferol, (DRISDOL) 50000 UNITS CAPS capsule, Take 50,000 Units by mouth every 7 (seven) days., Disp: , Rfl:  .  Fluticasone Furoate-Vilanterol (BREO ELLIPTA) 100-25 MCG/INH AEPB, Inhale 1  puff into the lungs daily., Disp: 60 each, Rfl: 2  Allergies  Allergen Reactions  . Eggs Or Egg-Derived Products   . Fish Allergy   . Peanut Oil   . Peanut-Containing Drug Products   . Penicillins   . Shellfish Allergy   . Strawberry Extract   . Wheat Bran      ROS  Constitutional: Negative for fever or weight change.  Respiratory: Negative for cough and shortness of breath.   Cardiovascular: Negative for chest pain or palpitations.  Gastrointestinal: Negative for abdominal pain, no bowel changes.  Musculoskeletal: Negative for gait problem or joint swelling.  Skin: Negative for rash.  Neurological: Negative for dizziness or headache.  No other specific complaints in a complete review of systems (except as listed in HPI above).  Objective  Filed Vitals:   12/17/15 1034  BP: 110/62  Pulse: 70  Temp: 98.1 F (36.7 C)  TempSrc: Oral  Resp: 16  Height:  (1.803 m)  Weight: 150 lb 3.2 oz (68.13 kg)  SpO2: 95%    Body mass index is 20.96 kg/(m^2).  Physical Exam  Constitutional: Patient appears well-developed and well-nourished.  No distress.  HEENT: head atraumatic, normocephalic, pupils equal and reactive to light,neck supple, throat within normal limits Cardiovascular: Normal rate, regular rhythm and normal heart sounds.  No murmur heard. No BLE edema. Pulmonary/Chest: Effort normal and breath sounds normal. No respiratory distress. Abdominal: Soft.  There is no tenderness. Psychiatric: Patient has a normal mood and affect. behavior is normal. Judgment and thought content normal. Rash: under control at this time  PHQ2/9: Depression screen [facility] 2/9 12/17/2015 06/28/2015  Decreased Interest 0 0  Down, Depressed, Hopeless 0 0  PHQ - 2 Score 0 0     Fall Risk: Fall Risk  12/17/2015 06/28/2015  Falls in the past year? No No      Functional Status Survey: Is the patient deaf or have difficulty hearing?: No Does the patient have difficulty seeing, even when  wearing glasses/contacts?: No Does the patient have difficulty concentrating, remembering, or making decisions?: No Does the patient have difficulty walking or climbing stairs?: No Does the patient have difficulty dressing or bathing?: No Does the patient have difficulty doing errands alone such as visiting a doctor's office or shopping?: No   Assessment & Plan   1. Asthma, moderate persistent, uncomplicated  Continue current medication, Spirometry still showed Fev1/FVC and improved by 23 % after that  - Spirometry: Pre & Post Eval - albuterol (PROVENTIL) (2.5 MG/3ML) 0.083% nebulizer solution 2.5 mg; Take 3 mLs (2.5 mg total) by nebulization once. - albuterol (PROAIR HFA) 108 (90 Base) MCG/ACT inhaler; Inhale 1-2 puffs into the lungs every 6 (six) hours as needed.  Dispense: 1 Inhaler; Refill: 0 - beclomethasone (QVAR) 40 MCG/ACT inhaler; Inhale 2  puffs into the lungs 2 (two) times daily.  Dispense: 1 Inhaler; Refill: 5  2. Dermatitis, eczematoid  - triamcinolone cream (KENALOG) 0.5 %; Apply topically 2 (two) times daily.  Dispense: 75 g; Refill: 0  3. Anorexia nervosa, restricting type  In remission, doing well

## 2016-02-16 ENCOUNTER — Ambulatory Visit (INDEPENDENT_AMBULATORY_CARE_PROVIDER_SITE_OTHER): Payer: Medicaid Other | Admitting: Family Medicine

## 2016-02-16 ENCOUNTER — Encounter: Payer: Medicaid Other | Admitting: Family Medicine

## 2016-02-16 ENCOUNTER — Encounter: Payer: Self-pay | Admitting: Family Medicine

## 2016-02-16 VITALS — BP 112/70 | HR 94 | Temp 97.7°F | Resp 18 | Wt 143.0 lb

## 2016-02-16 DIAGNOSIS — Z0000 Encounter for general adult medical examination without abnormal findings: Secondary | ICD-10-CM

## 2016-02-16 DIAGNOSIS — Z113 Encounter for screening for infections with a predominantly sexual mode of transmission: Secondary | ICD-10-CM | POA: Diagnosis not present

## 2016-02-16 NOTE — Progress Notes (Signed)
Name: Ricardo BaileyShzaad T Ramos   MRN: [id number]    DOB: 11/15/1996   Date:02/16/2016       Progress Note  Subjective  Chief Complaint  Chief Complaint  Patient presents with  . Annual Exam  . Labs Only    HPI  Male Exam: he came in today for CPE, he denies any penile discharge, no dysuria, he is sexually active, same partner for the past couple of years - he has a girlfriend.   Patient Active Problem List   Diagnosis Date Noted  . Acne 06/28/2015  . Anorexia nervosa, restricting type 06/28/2015  . CN (constipation) 06/28/2015  . Allergic contact dermatitis 06/28/2015  . Dermatitis, eczematoid 06/28/2015  . Asthma, moderate persistent 06/28/2015  . Allergic to peanuts 06/28/2015  . Allergic rhinitis, seasonal 06/28/2015  . Vitamin D deficiency 06/28/2015  . Allergic conjunctivitis and rhinitis 06/28/2015    Past Surgical History  Procedure Laterality Date  . Tonsillectomy and adenoidectomy      Family History  Problem Relation Age of Onset  . Asthma Sister   . Allergies Sister     Nuts    Social History   Social History  . Marital Status: Single    Spouse Name: N/A  . Number of Children: N/A  . Years of Education: N/A   Occupational History  . Not on file.   Social History Main Topics  . Smoking status: Never Smoker   . Smokeless tobacco: Never Used  . Alcohol Use: No  . Drug Use: No  . Sexual Activity:    Partners: Female    Pharmacist, hospitalBirth Control/ Protection: Condom   Other Topics Concern  . Not on file   Social History Narrative     Current outpatient prescriptions:  .  albuterol (PROAIR HFA) 108 (90 Base) MCG/ACT inhaler, Inhale 1-2 puffs into the lungs every 6 (six) hours as needed., Disp: 1 Inhaler, Rfl: 0 .  albuterol (PROVENTIL) (2.5 MG/3ML) 0.083% nebulizer solution, Take 3 mLs (2.5 mg total) by nebulization every 6 (six) hours as needed for wheezing or shortness of breath., Disp: 75 mL, Rfl: 0 .  beclomethasone (QVAR) 40 MCG/ACT inhaler, Inhale 2 puffs  into the lungs 2 (two) times daily., Disp: 1 Inhaler, Rfl: 5 .  EPINEPHrine (EPIPEN 2-PAK) 0.3 mg/0.3 mL IJ SOAJ injection, EPIPEN 2-PAK, 0.3MG /0.3ML (Injection Solution Auto-injector)  1 Device use as directed for 30 days  Quantity: 1;  Refills: 1   Ordered :14-Aug-2014  Alba CorySOWLES, Maia Handa MD;  Started 14-Aug-2014 Active, Disp: , Rfl:  .  loratadine (CLARITIN) 10 MG tablet, Take 10 mg by mouth daily., Disp: , Rfl:  .  Respiratory Therapy Supplies (NEBULIZER AIR TUBE/PLUGS) MISC, 1 each by Does not apply route daily., Disp: 1 each, Rfl: 2 .  triamcinolone cream (KENALOG) 0.5 %, Apply topically 2 (two) times daily., Disp: 75 g, Rfl: 0 .  Vitamin D, Ergocalciferol, (DRISDOL) 50000 UNITS CAPS capsule, Take 50,000 Units by mouth every 7 (seven) days., Disp: , Rfl:   Allergies  Allergen Reactions  . Eggs Or Egg-Derived Products   . Fish Allergy   . Peanut Oil   . Peanut-Containing Drug Products   . Penicillins   . Shellfish Allergy   . Strawberry Extract   . Wheat Bran      ROS  Constitutional: Negative for fever or weight change.  Respiratory: Negative for cough and shortness of breath.   Cardiovascular: Negative for chest pain or palpitations.  Gastrointestinal: Negative for abdominal pain, no bowel changes.  Musculoskeletal: Negative for gait problem or joint swelling.  Skin: Negative for rash.  Neurological: Negative for dizziness or headache.  No other specific complaints in a complete review of systems (except as listed in HPI above).  Objective  Filed Vitals:   02/16/16 1114  BP: 112/70  Pulse: 94  Temp: 97.7 F (36.5 C)  TempSrc: Oral  Resp: 18  Weight: 143 lb (64.864 kg)  SpO2: 98%    Body mass index is 19.95 kg/(m^2).  Physical Exam   Constitutional: Patient appears well-developed and well-nourished. No distress.  HENT: Head: Normocephalic and atraumatic. Ears: B TMs ok, no erythema or effusion; Nose: Nose normal. Mouth/Throat: Oropharynx is clear and moist. No  oropharyngeal exudate.  Eyes: Conjunctivae and EOM are normal. Pupils are equal, round, and reactive to light. No scleral icterus.  Neck: Normal range of motion. Neck supple. No JVD present. No thyromegaly present.  Cardiovascular: Normal rate, regular rhythm and normal heart sounds.  No murmur heard. No BLE edema. Pulmonary/Chest: Effort normal and breath sounds normal. No respiratory distress. Abdominal: Soft. Bowel sounds are normal, no distension. There is no tenderness. no masses MALE GENITALIA: Normal descended testes bilaterally, no masses palpated, no hernias, no lesions, no discharge RECTAL: not done Musculoskeletal: Normal range of motion, no joint effusions. No gross deformities Neurological: he is alert and oriented to person, place, and time. No cranial nerve deficit. Coordination, balance, strength, speech and gait are normal.  Skin: Skin is warm and dry. No rash noted. No erythema.  Psychiatric: Patient has a normal mood and affect. behavior is normal. Judgment and thought content normal.  PHQ2/9: Depression screen [facility] 2/9 02/16/2016 12/17/2015 06/28/2015  Decreased Interest 0 0 0  Down, Depressed, Hopeless 0 0 0  PHQ - 2 Score 0 0 0    Fall Risk: Fall Risk  02/16/2016 12/17/2015 06/28/2015  Falls in the past year? No No No    Functional Status Survey: Is the patient deaf or have difficulty hearing?: No Does the patient have difficulty seeing, even when wearing glasses/contacts?: No Does the patient have difficulty concentrating, remembering, or making decisions?: No Does the patient have difficulty walking or climbing stairs?: No Does the patient have difficulty dressing or bathing?: No Does the patient have difficulty doing errands alone such as visiting a doctor's office or shopping?: No    Assessment & Plan  1. Encounter for routine history and physical exam for male  Discussed exercise daily for at least 2 hours, eat 6 servings of fruit and vegetables daily, eat  tree nuts ( pistachios, pecans , almonds...) one serving every other day, eat fish twice weekly. Have responsibilities  at home. To avoid STI's, practice abstinence, if unable use condoms and stick with one partner.  Discussed importance of contraception if sexually active to avoid unwanted pregnancy.   2. Routine screening for STI (sexually transmitted infection)  - Chlamydia/Gonococcus/Trichomonas, NAA

## 2016-02-18 ENCOUNTER — Encounter: Payer: Self-pay | Admitting: Family Medicine

## 2016-02-18 LAB — CHLAMYDIA/GONOCOCCUS/TRICHOMONAS, NAA
Chlamydia by NAA: NEGATIVE
GONOCOCCUS BY NAA: NEGATIVE
TRICH VAG BY NAA: NEGATIVE

## 2016-06-16 ENCOUNTER — Encounter: Payer: Self-pay | Admitting: Family Medicine

## 2016-06-16 ENCOUNTER — Ambulatory Visit (INDEPENDENT_AMBULATORY_CARE_PROVIDER_SITE_OTHER): Payer: Medicaid Other | Admitting: Family Medicine

## 2016-06-16 VITALS — BP 110/68 | HR 80 | Temp 97.9°F | Resp 16 | Wt 151.5 lb

## 2016-06-16 DIAGNOSIS — L309 Dermatitis, unspecified: Secondary | ICD-10-CM | POA: Diagnosis not present

## 2016-06-16 DIAGNOSIS — F5001 Anorexia nervosa, restricting type: Secondary | ICD-10-CM

## 2016-06-16 DIAGNOSIS — J302 Other seasonal allergic rhinitis: Secondary | ICD-10-CM | POA: Diagnosis not present

## 2016-06-16 DIAGNOSIS — Z91010 Allergy to peanuts: Secondary | ICD-10-CM | POA: Diagnosis not present

## 2016-06-16 DIAGNOSIS — E559 Vitamin D deficiency, unspecified: Secondary | ICD-10-CM

## 2016-06-16 DIAGNOSIS — J4520 Mild intermittent asthma, uncomplicated: Secondary | ICD-10-CM | POA: Diagnosis not present

## 2016-06-16 DIAGNOSIS — J4541 Moderate persistent asthma with (acute) exacerbation: Secondary | ICD-10-CM

## 2016-06-16 MED ORDER — EPINEPHRINE 0.3 MG/0.3ML IJ SOAJ: 0.3000 mg | Freq: Once | INTRAMUSCULAR | Status: DC

## 2016-06-16 MED ORDER — TRIAMCINOLONE ACETONIDE 0.5 % EX CREA: TOPICAL_CREAM | Freq: Two times a day (BID) | CUTANEOUS | Status: DC

## 2016-06-16 NOTE — Progress Notes (Signed)
Name: Ricardo Ramos   MRN: [id number]    DOB: [birth year]   Date:06/16/2016       Progress Note  Subjective  Chief Complaint  Chief Complaint  Patient presents with  . Asthma    patient is here for his 6858-month f/u  . eczema    needs a refill of meds    HPI  Asthma Mild Intermittentt: he was hospitalized back in December with an acute onset of SOB and wheezing. He was  using Qvar twice daily but now only using medication prn, no longer wheezing, no cough or SOB. Explained importance of resuming Qvar and using Proair 4 times a day at the first sign of URI to prevent severe asthma flare.   Anorexia Nervosa: he has the restrictive type, no longer having therapy, he has gained some weight since last visit He denies use of laxatives, he states he has not been counting calories lately, in the past used to eat 1200 calories daily. He is no longer over  exercising.   Eczema: he needs a refill Triamcinolone, symptoms are intermittent and he only uses it prn. Usually on popliteal fossa now. Rash is getting worse.  Peanut allergy: he needs refill of Epipen.    Patient Active Problem List   Diagnosis Date Noted  . Acne 06/28/2015  . Anorexia nervosa, restricting type 06/28/2015  . CN (constipation) 06/28/2015  . Allergic contact dermatitis 06/28/2015  . Dermatitis, eczematoid 06/28/2015  . Asthma, moderate persistent 06/28/2015  . Allergic to peanuts 06/28/2015  . Allergic rhinitis, seasonal 06/28/2015  . Vitamin D deficiency 06/28/2015  . Allergic conjunctivitis and rhinitis 06/28/2015    Past Surgical History  Procedure Laterality Date  . Tonsillectomy and adenoidectomy      Family History  Problem Relation Age of Onset  . Asthma Sister   . Allergies Sister     Nuts    Social History   Social History  . Marital Status: Single    Spouse Name: N/A  . Number of Children: N/A  . Years of Education: N/A   Occupational History  . Not on file.   Social History Main  Topics  . Smoking status: Never Smoker   . Smokeless tobacco: Never Used  . Alcohol Use: No  . Drug Use: No  . Sexual Activity:    Partners: Female    Pharmacist, hospitalBirth Control/ Protection: Condom   Other Topics Concern  . Not on file   Social History Narrative     Current outpatient prescriptions:  .  albuterol (PROAIR HFA) 108 (90 Base) MCG/ACT inhaler, Inhale 1-2 puffs into the lungs every 6 (six) hours as needed., Disp: 1 Inhaler, Rfl: 0 .  albuterol (PROVENTIL) (2.5 MG/3ML) 0.083% nebulizer solution, Take 3 mLs (2.5 mg total) by nebulization every 6 (six) hours as needed for wheezing or shortness of breath., Disp: 75 mL, Rfl: 0 .  beclomethasone (QVAR) 40 MCG/ACT inhaler, Inhale 2 puffs into the lungs 2 (two) times daily., Disp: 1 Inhaler, Rfl: 5 .  EPINEPHrine (EPIPEN 2-PAK) 0.3 mg/0.3 mL IJ SOAJ injection, Inject 0.3 mLs (0.3 mg total) into the muscle once. Mylan brand or brand name, Disp: 1 Device, Rfl: 0 .  loratadine (CLARITIN) 10 MG tablet, Take 10 mg by mouth daily., Disp: , Rfl:  .  Respiratory Therapy Supplies (NEBULIZER AIR TUBE/PLUGS) MISC, 1 each by Does not apply route daily., Disp: 1 each, Rfl: 2 .  triamcinolone cream (KENALOG) 0.5 %, Apply topically 2 (two) times daily., Disp: 75  g, Rfl: 0  Allergies  Allergen Reactions  . Eggs Or Egg-Derived Products   . Fish Allergy   . Peanut Oil   . Peanut-Containing Drug Products   . Penicillins   . Shellfish Allergy   . Strawberry Extract   . Wheat Bran      ROS  Constitutional: Negative for fever, positive for weight change.  Respiratory: Negative for cough and shortness of breath.   Cardiovascular: Negative for chest pain or palpitations.  Gastrointestinal: Negative for abdominal pain, no bowel changes.  Musculoskeletal: Negative for gait problem or joint swelling.  Skin: Positive  for rash.  Neurological: Negative for dizziness or headache.  No other specific complaints in a complete review of systems (except as listed  in HPI above).  Objective  Filed Vitals:   06/16/16 1004  BP: 110/68  Pulse: 80  Temp: 97.9 F (36.6 C)  TempSrc: Oral  Resp: 16  Weight: 151 lb 8 oz (68.72 kg)  SpO2: 99%    Body mass index is 21.14 kg/(m^2).  Physical Exam  Constitutional: Patient appears well-developed and well-nourished.  No distress.  HEENT: head atraumatic, normocephalic, pupils equal and reactive to light, neck supple, throat within normal limits Cardiovascular: Normal rate, regular rhythm and normal heart sounds.  No murmur heard. No BLE edema. Pulmonary/Chest: Effort normal and breath sounds normal. No respiratory distress. Abdominal: Soft.  There is no tenderness. Psychiatric: Patient has a normal mood and affect. behavior is normal. Judgment and thought content normal. Skin: eczematous patch on both ante-cubital area  PHQ2/9: Depression screen [facility] 2/9 06/16/2016 02/16/2016 12/17/2015 06/28/2015  Decreased Interest 0 0 0 0  Down, Depressed, Hopeless 0 0 0 0  PHQ - 2 Score 0 0 0 0    Fall Risk: Fall Risk  06/16/2016 02/16/2016 12/17/2015 06/28/2015  Falls in the past year? No No No No    Functional Status Survey: Is the patient deaf or have difficulty hearing?: No Does the patient have difficulty seeing, even when wearing glasses/contacts?: No Does the patient have difficulty concentrating, remembering, or making decisions?: No Does the patient have difficulty walking or climbing stairs?: No Does the patient have difficulty dressing or bathing?: No Does the patient have difficulty doing errands alone such as visiting a doctor's office or shopping?: No    Assessment & Plan  1. Asthma, mild intermittent, well-controlled  Normal   - Spirometry: Peak  2. Allergic rhinitis, seasonal  Doing well at this time  3. Dermatitis, eczematoid  - triamcinolone cream (KENALOG) 0.5 %; Apply topically 2 (two) times daily.  Dispense: 75 g; Refill: 0  4. Vitamin D deficiency  Continue otc supplementation    5. Anorexia nervosa, restricting type  In remission   6. Allergic to peanuts  - EPINEPHrine (EPIPEN 2-PAK) 0.3 mg/0.3 mL IJ SOAJ injection; Inject 0.3 mLs (0.3 mg total) into the muscle once. Mylan brand or brand name  Dispense: 1 Device; Refill: 0

## 2016-12-04 ENCOUNTER — Other Ambulatory Visit: Payer: Self-pay | Admitting: Family Medicine

## 2016-12-04 DIAGNOSIS — J4540 Moderate persistent asthma, uncomplicated: Secondary | ICD-10-CM

## 2016-12-20 ENCOUNTER — Other Ambulatory Visit: Payer: Self-pay | Admitting: Family Medicine

## 2016-12-20 DIAGNOSIS — L309 Dermatitis, unspecified: Secondary | ICD-10-CM

## 2016-12-20 NOTE — Telephone Encounter (Signed)
Patient requesting refill of Kenalog to [facility].

## 2017-01-22 ENCOUNTER — Other Ambulatory Visit: Payer: Self-pay | Admitting: Family Medicine

## 2017-01-22 MED ORDER — QVAR REDIHALER 40 MCG/ACT IN AERB: 2.0000 | INHALATION_SPRAY | Freq: Two times a day (BID) | RESPIRATORY_TRACT | 2 refills | Status: DC

## 2017-01-30 ENCOUNTER — Ambulatory Visit (INDEPENDENT_AMBULATORY_CARE_PROVIDER_SITE_OTHER): Payer: Medicaid Other | Admitting: Family Medicine

## 2017-01-30 ENCOUNTER — Encounter: Payer: Self-pay | Admitting: Family Medicine

## 2017-01-30 VITALS — BP 98/64 | HR 104 | Temp 97.7°F | Resp 18 | Ht 71.0 in | Wt 146.9 lb

## 2017-01-30 DIAGNOSIS — E559 Vitamin D deficiency, unspecified: Secondary | ICD-10-CM | POA: Diagnosis not present

## 2017-01-30 DIAGNOSIS — Z113 Encounter for screening for infections with a predominantly sexual mode of transmission: Secondary | ICD-10-CM | POA: Diagnosis not present

## 2017-01-30 DIAGNOSIS — Z0000 Encounter for general adult medical examination without abnormal findings: Secondary | ICD-10-CM | POA: Diagnosis not present

## 2017-01-30 LAB — COMPLETE METABOLIC PANEL WITH GFR
ALBUMIN: 4.3 g/dL (ref 3.6–5.1)
ALK PHOS: 121 U/L — AB (ref 40–115)
ALT: 14 U/L (ref 9–46)
AST: 21 U/L (ref 10–40)
BILIRUBIN TOTAL: 1 mg/dL (ref 0.2–1.2)
BUN: 19 mg/dL (ref 7–25)
CO2: 24 mmol/L (ref 20–31)
Calcium: 9.7 mg/dL (ref 8.6–10.3)
Chloride: 104 mmol/L (ref 98–110)
Creat: 1.5 mg/dL — ABNORMAL HIGH (ref 0.60–1.35)
GFR, EST AFRICAN AMERICAN: 76 mL/min (ref >=60)
GFR, EST NON AFRICAN AMERICAN: 66 mL/min (ref >=60)
GLUCOSE: 89 mg/dL (ref 65–99)
POTASSIUM: 4.2 mmol/L (ref 3.5–5.3)
SODIUM: 140 mmol/L (ref 135–146)
TOTAL PROTEIN: 7.1 g/dL (ref 6.1–8.1)

## 2017-01-30 LAB — LIPID PANEL
CHOLESTEROL: 170 mg/dL (ref <200)
HDL: 57 mg/dL (ref >40)
LDL CALC: 98 mg/dL (ref <100)
TRIGLYCERIDES: 73 mg/dL (ref <150)
Total CHOL/HDL Ratio: 3 Ratio (ref <5.0)
VLDL: 15 mg/dL (ref <30)

## 2017-01-30 LAB — CBC WITH DIFFERENTIAL/PLATELET
BASOS ABS: 42 {cells}/uL (ref 0–200)
Basophils Relative: 1 %
EOS PCT: 2 %
Eosinophils Absolute: 84 cells/uL (ref 15–500)
HCT: 46.6 % (ref 38.5–50.0)
HEMOGLOBIN: 15.2 g/dL (ref 13.2–17.1)
LYMPHS ABS: 1806 {cells}/uL (ref 850–3900)
Lymphocytes Relative: 43 %
MCH: 29 pg (ref 27.0–33.0)
MCHC: 32.6 g/dL (ref 32.0–36.0)
MCV: 88.8 fL (ref 80.0–100.0)
MONOS PCT: 11 %
MPV: 10.2 fL (ref 7.5–12.5)
Monocytes Absolute: 462 cells/uL (ref 200–950)
NEUTROS PCT: 43 %
Neutro Abs: 1806 cells/uL (ref 1500–7800)
Platelets: 248 10*3/uL (ref 140–400)
RBC: 5.25 MIL/uL (ref 4.20–5.80)
RDW: 13.3 % (ref 11.0–15.0)
WBC: 4.2 10*3/uL (ref 3.8–10.8)

## 2017-01-30 LAB — HIV ANTIBODY (ROUTINE TESTING W REFLEX): HIV 1&2 Ab, 4th Generation: NONREACTIVE

## 2017-01-30 NOTE — Progress Notes (Signed)
Name: Ricardo Ramos   MRN: [id number]    DOB: [birth year]   Date:01/30/2017       Progress Note  Subjective  Chief Complaint  Chief Complaint  Patient presents with  . Annual Exam    HPI  Annual Exam: Ricardo Ramos is now 21 yo, denies drug use, tobacco or alcohol use. Working full time. Lives with grandmothers. No problems today. Denies any recurrence of anorexia but he has a physical job.   Patient Active Problem List   Diagnosis Date Noted  . Acne 06/28/2015  . Anorexia nervosa, restricting type 06/28/2015  . CN (constipation) 06/28/2015  . Allergic contact dermatitis 06/28/2015  . Dermatitis, eczematoid 06/28/2015  . Asthma, moderate persistent 06/28/2015  . Allergic to peanuts 06/28/2015  . Allergic rhinitis, seasonal 06/28/2015  . Vitamin D deficiency 06/28/2015  . Allergic conjunctivitis and rhinitis 06/28/2015    Past Surgical History:  Procedure Laterality Date  . TONSILLECTOMY AND ADENOIDECTOMY      Family History  Problem Relation Age of Onset  . Asthma Sister   . Allergies Sister     Nuts    Social History   Social History  . Marital status: Single    Spouse name: N/A  . Number of children: N/A  . Years of education: N/A   Occupational History  . factory worker     Art gallery manager   Social History Main Topics  . Smoking status: Never Smoker  . Smokeless tobacco: Never Used  . Alcohol use No  . Drug use: No  . Sexual activity: Yes    Partners: Female    Birth control/ protection: Condom   Other Topics Concern  . Not on file   Social History Narrative   Working 2nd shift at [employer]. Started in 2017   Graduated HS 2016   Lives with grandmother     Current Outpatient Prescriptions:  .  albuterol (PROVENTIL) (2.5 MG/3ML) 0.083% nebulizer solution, Take 3 mLs (2.5 mg total) by nebulization every 6 (six) hours as needed for wheezing or shortness of breath., Disp: 75 mL, Rfl: 0 .  EPINEPHrine (EPIPEN 2-PAK) 0.3 mg/0.3 mL IJ SOAJ  injection, Inject 0.3 mLs (0.3 mg total) into the muscle once. Mylan brand or brand name, Disp: 1 Device, Rfl: 0 .  loratadine (CLARITIN) 10 MG tablet, Take 10 mg by mouth daily., Disp: , Rfl:  .  PROAIR HFA 108 (90 Base) MCG/ACT inhaler, INHALE 1-2 PUFFS INTO THE LUNGS EVERY 6 (SIX) HOURS AS NEEDED., Disp: 8.5 Inhaler, Rfl: 0 .  Respiratory Therapy Supplies (NEBULIZER AIR TUBE/PLUGS) MISC, 1 each by Does not apply route daily., Disp: 1 each, Rfl: 2 .  triamcinolone cream (KENALOG) 0.5 %, APPLY TOPICALLY 2 (TWO) TIMES DAILY., Disp: 75 g, Rfl: 0  Allergies  Allergen Reactions  . Eggs Or Egg-Derived Products   . Fish Allergy   . Peanut Oil   . Peanut-Containing Drug Products   . Penicillins   . Shellfish Allergy   . Strawberry Extract   . Wheat Bran      ROS  Constitutional: Negative for fever or weight change.  Respiratory: Negative for cough and shortness of breath.   Cardiovascular: Negative for chest pain or palpitations.  Gastrointestinal: Negative for abdominal pain, no bowel changes.  Musculoskeletal: Negative for gait problem or joint swelling.  Skin: Negative for rash.  Neurological: Negative for dizziness or headache.  No other specific complaints in a complete review of systems (except as listed in HPI  above).  Objective  Vitals:   01/30/17 1102  BP: 98/64  Pulse: (!) 104  Resp: 18  Temp: 97.7 F (36.5 C)  TempSrc: Oral  SpO2: 98%  Weight: 146 lb 14.4 oz (66.6 kg)  Height: 5\' 11"  (1.803 m)    Body mass index is 20.49 kg/m.  Physical Exam  Constitutional: Patient appears well-developed and well-nourished. No distress.  HENT: Head: Normocephalic and atraumatic. Ears: B TMs ok, no erythema or effusion; Nose: Nose normal. Mouth/Throat: Oropharynx is clear and moist. No oropharyngeal exudate.  Eyes: Conjunctivae and EOM are normal. Pupils are equal, round, and reactive to light. No scleral icterus.  Neck: Normal range of motion. Neck supple. No JVD present. No  thyromegaly present.  Cardiovascular: Normal rate, regular rhythm and normal heart sounds.  No murmur heard. No BLE edema. Pulmonary/Chest: Effort normal and breath sounds normal. No respiratory distress. Abdominal: Soft. Bowel sounds are normal, no distension. There is no tenderness. no masses MALE GENITALIA: Normal descended testes bilaterally, no masses palpated, no hernias, no lesions, no discharge RECTAL: not done Musculoskeletal: Normal range of motion, no joint effusions. No gross deformities Neurological: he is alert and oriented to person, place, and time. No cranial nerve deficit. Coordination, balance, strength, speech and gait are normal.  Skin: Skin is warm and dry. No rash noted. No erythema.  Psychiatric: Patient has a normal mood and affect. behavior is normal. Judgment and thought content normal.  PHQ2/9: [facility] 2/9 01/30/2017 06/16/2016 02/16/2016 12/17/2015 06/28/2015  Decreased Interest 0 0 0 0 0  Down, Depressed, Hopeless 0 0 0 0 0  PHQ - 2 Score 0 0 0 0 0     Fall Risk: Fall Risk  01/30/2017 06/16/2016 02/16/2016 12/17/2015 06/28/2015  Falls in the past year? No No No No No    Functional Status Survey: Is the patient deaf or have difficulty hearing?: No Does the patient have difficulty seeing, even when wearing glasses/contacts?: No Does the patient have difficulty concentrating, remembering, or making decisions?: No Does the patient have difficulty walking or climbing stairs?: No Does the patient have difficulty dressing or bathing?: No Does the patient have difficulty doing errands alone such as visiting a doctor's office or shopping?: No   Assessment & Plan  1. Encounter for routine history and physical exam for male  - Lipid panel - CBC with Differential/Platelet - COMPLETE METABOLIC PANEL WITH GFR Discussed importance of 150 minutes of physical activity weekly, eat two servings of fish weekly, eat one serving of tree nuts ( cashews, pistachios,  pecans, almonds.Marland Kitchen.) every other day, eat 6 servings of fruit/vegetables daily and drink plenty of water and avoid sweet beverages. Discussed importance of condoms.   2. Routine screening for STI (sexually transmitted infection)  - GC Probe amplification, urine - RPR - HIV antibody  3. Vitamin D deficiency  - VITAMIN D 25 Hydroxy (Vit-D Deficiency, Fractures)

## 2017-01-31 LAB — GC/CHLAMYDIA PROBE AMP
CT PROBE, AMP APTIMA: NOT DETECTED
GC PROBE AMP APTIMA: NOT DETECTED

## 2017-01-31 LAB — RPR

## 2017-01-31 LAB — VITAMIN D 25 HYDROXY (VIT D DEFICIENCY, FRACTURES): Vit D, 25-Hydroxy: 18 ng/mL — ABNORMAL LOW (ref 30–100)

## 2017-02-01 ENCOUNTER — Other Ambulatory Visit: Payer: Self-pay | Admitting: Family Medicine

## 2017-02-01 MED ORDER — VITAMIN D (ERGOCALCIFEROL) 1.25 MG (50000 UNIT) PO CAPS: 50000.0000 [IU] | ORAL_CAPSULE | ORAL | 0 refills | Status: DC

## 2017-03-14 ENCOUNTER — Ambulatory Visit: Payer: Medicaid Other | Admitting: Family Medicine

## 2017-03-22 ENCOUNTER — Encounter: Payer: Self-pay | Admitting: Family Medicine

## 2017-03-22 ENCOUNTER — Ambulatory Visit (INDEPENDENT_AMBULATORY_CARE_PROVIDER_SITE_OTHER): Payer: Self-pay | Admitting: Family Medicine

## 2017-03-22 VITALS — BP 122/84 | HR 93 | Temp 98.1°F | Resp 16 | Ht 71.0 in | Wt 153.6 lb

## 2017-03-22 DIAGNOSIS — L309 Dermatitis, unspecified: Secondary | ICD-10-CM

## 2017-03-22 DIAGNOSIS — F5001 Anorexia nervosa, restricting type: Secondary | ICD-10-CM

## 2017-03-22 DIAGNOSIS — L308 Other specified dermatitis: Secondary | ICD-10-CM

## 2017-03-22 DIAGNOSIS — J3089 Other allergic rhinitis: Secondary | ICD-10-CM

## 2017-03-22 DIAGNOSIS — J4520 Mild intermittent asthma, uncomplicated: Secondary | ICD-10-CM

## 2017-03-22 DIAGNOSIS — Z91010 Allergy to peanuts: Secondary | ICD-10-CM

## 2017-03-22 DIAGNOSIS — E559 Vitamin D deficiency, unspecified: Secondary | ICD-10-CM

## 2017-03-22 DIAGNOSIS — J302 Other seasonal allergic rhinitis: Secondary | ICD-10-CM

## 2017-03-22 MED ORDER — TRIAMCINOLONE ACETONIDE 0.5 % EX CREA: TOPICAL_CREAM | Freq: Two times a day (BID) | CUTANEOUS | 0 refills | Status: DC

## 2017-03-22 MED ORDER — CRISABOROLE 2 % EX OINT: 2.0000 g | TOPICAL_OINTMENT | Freq: Two times a day (BID) | CUTANEOUS | 2 refills | Status: DC

## 2017-03-22 MED ORDER — EPINEPHRINE 0.3 MG/0.3ML IJ SOAJ: 0.3000 mg | Freq: Once | INTRAMUSCULAR | 0 refills | Status: AC
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
## 2017-07-23 ENCOUNTER — Ambulatory Visit (INDEPENDENT_AMBULATORY_CARE_PROVIDER_SITE_OTHER): Payer: BLUE CROSS/BLUE SHIELD | Admitting: Family Medicine

## 2017-07-23 ENCOUNTER — Encounter: Payer: Self-pay | Admitting: Family Medicine

## 2017-07-23 VITALS — BP 118/78 | HR 69 | Temp 97.7°F | Resp 18 | Ht 71.0 in | Wt 155.4 lb

## 2017-07-23 DIAGNOSIS — J302 Other seasonal allergic rhinitis: Secondary | ICD-10-CM

## 2017-07-23 DIAGNOSIS — F5001 Anorexia nervosa, restricting type: Secondary | ICD-10-CM | POA: Diagnosis not present

## 2017-07-23 DIAGNOSIS — J4520 Mild intermittent asthma, uncomplicated: Secondary | ICD-10-CM

## 2017-07-23 DIAGNOSIS — L308 Other specified dermatitis: Secondary | ICD-10-CM

## 2017-07-23 DIAGNOSIS — Z23 Encounter for immunization: Secondary | ICD-10-CM

## 2017-07-23 DIAGNOSIS — J3089 Other allergic rhinitis: Secondary | ICD-10-CM | POA: Diagnosis not present

## 2017-07-23 MED ORDER — BECLOMETHASONE DIPROP HFA 40 MCG/ACT IN AERB: 2.0000 | INHALATION_SPRAY | Freq: Two times a day (BID) | RESPIRATORY_TRACT | 0 refills | Status: AC

## 2017-07-23 MED ORDER — TRIAMCINOLONE ACETONIDE 0.5 % EX CREA: TOPICAL_CREAM | Freq: Two times a day (BID) | CUTANEOUS | 0 refills | Status: DC

## 2017-07-23 NOTE — Patient Instructions (Signed)
Call pharmacy about your Ricardo Ramos - if you still have refills, please fill this medication and use it twice a day along with your Triamcinolone cream.
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
## 2018-01-23 ENCOUNTER — Ambulatory Visit: Payer: BLUE CROSS/BLUE SHIELD | Admitting: Family Medicine

## 2018-01-29 ENCOUNTER — Encounter: Payer: Self-pay | Admitting: Family Medicine

## 2018-01-29 ENCOUNTER — Ambulatory Visit: Payer: BLUE CROSS/BLUE SHIELD | Admitting: Family Medicine

## 2018-01-29 VITALS — BP 100/70 | HR 55 | Resp 14 | Ht 71.0 in | Wt 154.3 lb

## 2018-01-29 DIAGNOSIS — J3089 Other allergic rhinitis: Secondary | ICD-10-CM | POA: Diagnosis not present

## 2018-01-29 DIAGNOSIS — E559 Vitamin D deficiency, unspecified: Secondary | ICD-10-CM | POA: Diagnosis not present

## 2018-01-29 DIAGNOSIS — F5001 Anorexia nervosa, restricting type: Secondary | ICD-10-CM | POA: Diagnosis not present

## 2018-01-29 DIAGNOSIS — L2082 Flexural eczema: Secondary | ICD-10-CM | POA: Diagnosis not present

## 2018-01-29 DIAGNOSIS — Z91010 Allergy to peanuts: Secondary | ICD-10-CM

## 2018-01-29 DIAGNOSIS — J4520 Mild intermittent asthma, uncomplicated: Secondary | ICD-10-CM

## 2018-01-29 DIAGNOSIS — J302 Other seasonal allergic rhinitis: Secondary | ICD-10-CM

## 2018-01-29 DIAGNOSIS — L308 Other specified dermatitis: Secondary | ICD-10-CM | POA: Diagnosis not present

## 2018-01-29 MED ORDER — TRIAMCINOLONE ACETONIDE 0.5 % EX CREA: TOPICAL_CREAM | Freq: Two times a day (BID) | CUTANEOUS | 0 refills | Status: DC

## 2018-01-29 MED ORDER — EPINEPHRINE 0.3 MG/0.3ML IJ SOAJ: 0.3000 mg | Freq: Once | INTRAMUSCULAR | 1 refills | Status: AC

## 2018-01-29 MED ORDER — VITAMIN D (ERGOCALCIFEROL) 1.25 MG (50000 UNIT) PO CAPS: 50000.0000 [IU] | ORAL_CAPSULE | ORAL | 0 refills | Status: DC

## 2018-01-29 NOTE — Progress Notes (Signed)
Name: Ricardo Ramos   MRN: [id number]    DOB: Jun 27, 1996   Date:01/29/2018       Progress Note  Subjective  Chief Complaint  Chief Complaint  Patient presents with  . Asthma  . Acne    HPI  Eczema Pt rx kenalog cream Skin is much better. Pt instructed to use moisturizer instead of kenalog cream on AC area  AR Pt rx Claritin. Denies nasal congestion/dainage, red itchy eyes.   VitD Deficiency  Last Vit D 18 (01/2017). Pt sent rx for weekly Vitamin D then to take OTC vitamin D when course completed. Pt still taking daily OTC vitamin D   Asthma Last spirometry (06/2017) shows FEV1/FVC 92% predicted.Patient rx albuterol and Qvar inhaler PRN. Pt has not needed either inhaler in the last month. Pt denies shob, cough.   Anorexia- restrictive type Completed therapy. In remission- no longer counting calories or over-exercising. Pt occasionally skips lunch when he is busy at work but states he tries not to- he usually has a snack in between like chips and a soda. Patient eats home-cooked meal for dinner.    Patient Active Problem List   Diagnosis Date Noted  . Acne 06/28/2015  . Anorexia nervosa, restricting type 06/28/2015  . CN (constipation) 06/28/2015  . Allergic contact dermatitis 06/28/2015  . Dermatitis, eczematoid 06/28/2015  . Asthma, moderate persistent 06/28/2015  . Allergic to peanuts 06/28/2015  . Perennial allergic rhinitis with seasonal variation 06/28/2015  . Vitamin D deficiency 06/28/2015  . Allergic conjunctivitis and rhinitis 06/28/2015    Past Surgical History:  Procedure Laterality Date  . TONSILLECTOMY AND ADENOIDECTOMY      Family History  Problem Relation Age of Onset  . Asthma Sister   . Allergies Sister        Nuts    Social History   Socioeconomic History  . Marital status: Single    Spouse name: Not on file  . Number of children: Not on file  . Years of education: Not on file  . Highest education level: Not on file  Social Needs  .  Financial resource strain: Not on file  . Food insecurity - worry: Not on file  . Food insecurity - inability: Not on file  . Transportation needs - medical: Not on file  . Transportation needs - non-medical: Not on file  Occupational History  . Occupation: Psychiatric nurse    Comment: Art gallery manager  Tobacco Use  . Smoking status: Never Smoker  . Smokeless tobacco: Never Used  Substance and Sexual Activity  . Alcohol use: No    Alcohol/week: 0.0 oz  . Drug use: No  . Sexual activity: Yes    Partners: Female    Birth control/protection: Condom  Other Topics Concern  . Not on file  Social History Narrative   Working 2nd shift at [employer]. Started in 2017   Graduated HS 2016   Lives with grandmother     Current Outpatient Medications:  .  albuterol (PROVENTIL) (2.5 MG/3ML) 0.083% nebulizer solution, Take 3 mLs (2.5 mg total) by nebulization every 6 (six) hours as needed for wheezing or shortness of breath., Disp: 75 mL, Rfl: 0 .  beclomethasone (QVAR REDIHALER) 40 MCG/ACT inhaler, Inhale 2 puffs into the lungs 2 (two) times daily., Disp: 1 Inhaler, Rfl: 0 .  Crisaborole (EUCRISA) 2 % OINT, Apply 2 g topically 2 (two) times daily., Disp: 60 g, Rfl: 2 .  loratadine (CLARITIN) 10 MG tablet, Take 10 mg  by mouth daily., Disp: , Rfl:  .  PROAIR HFA 108 (90 Base) MCG/ACT inhaler, INHALE 1-2 PUFFS INTO THE LUNGS EVERY 6 (SIX) HOURS AS NEEDED., Disp: 8.5 Inhaler, Rfl: 0 .  Respiratory Therapy Supplies (NEBULIZER AIR TUBE/PLUGS) MISC, 1 each by Does not apply route daily., Disp: 1 each, Rfl: 2 .  triamcinolone cream (KENALOG) 0.5 %, Apply topically 2 (two) times daily., Disp: 75 g, Rfl: 0 .  Vitamin D, Ergocalciferol, (DRISDOL) 50000 units CAPS capsule, Take 1 capsule (50,000 Units total) by mouth every 7 (seven) days., Disp: 12 capsule, Rfl: 0  Allergies  Allergen Reactions  . Eggs Or Egg-Derived Products   . Fish Allergy   . Peanut Oil   . Peanut-Containing Drug Products   .  Penicillins   . Shellfish Allergy   . Strawberry Extract   . Wheat Bran     ROS Constitutional: Negative for fever or weight change.  Respiratory: Negative for cough and shortness of breath.   Cardiovascular: Negative for chest pain or palpitations.  Gastrointestinal: Negative for abdominal pain, no bowel changes.  Musculoskeletal: Negative for gait problem or joint swelling.  Skin: positive for rash in right [facility]  Neurological: Negative for dizziness or headache.  No other specific complaints in a complete review of systems (except as listed in HPI above).  Objective  Vitals:   01/29/18 1527  BP: 100/70  Pulse: (!) 55  Resp: 14  SpO2: 98%  Weight: 154 lb 4.8 oz (70 kg)  Height: 5\' 11"  (1.803 m)     Body mass index is 21.52 kg/m.  Physical Exam  Constitutional: Patient appears well-developed and well-nourished.  No distress.  HEENT: head atraumatic, normocephalic, pupils equal and reactive to light, ears, neck supple, throat within normal limits Cardiovascular: normal rate for patient, regular rhythm and normal heart sounds.  No murmur heard. No BLE edema. Pulmonary/Chest: Effort normal and breath sounds normal. No respiratory distress. Abdominal: Soft.  There is no tenderness. Psychiatric: Patient has a normal mood and affect. behavior is normal. Judgment and thought content normal. Skin: small area of hyperpigmentation [facility]  PHQ2/9: [facility] 01/30/2017 06/16/2016 02/16/2016 12/17/2015 06/28/2015  Decreased Interest 0 0 0 0 0  Down, Depressed, Hopeless 0 0 0 0 0  PHQ - 2 Score 0 0 0 0 0    Fall Risk: Fall Risk  01/29/2018 01/30/2017 06/16/2016 02/16/2016 12/17/2015  Falls in the past year? No No No No No     Functional Status Survey: Is the patient deaf or have difficulty hearing?: No Does the patient have difficulty seeing, even when wearing glasses/contacts?: No Does the patient have difficulty concentrating, remembering, or making decisions?:  No Does the patient have difficulty walking or climbing stairs?: No Does the patient have difficulty dressing or bathing?: No Does the patient have difficulty doing errands alone such as visiting a doctor's office or shopping?: No   Assessment & Plan  1. Mild intermittent asthma in adult without complication  Doing well  2. Perennial allergic rhinitis with seasonal variation  Doing well   3. Anorexia nervosa, restricting type  In remission   4. Allergic to peanuts  Advised to have an epipen   5. Flexural eczema  - triamcinolone cream (KENALOG) 0.5 %; Apply topically 2 (two) times daily.  Dispense: 75 g; Refill: 0  6. Vitamin D deficiency  - Vitamin D, Ergocalciferol, (DRISDOL) 50000 units CAPS capsule; Take 1 capsule (50,000 Units total) by mouth every 7 (seven) days.  Dispense: 12 capsule; Refill: 0

## 2018-06-27 ENCOUNTER — Encounter: Payer: Self-pay | Admitting: Emergency Medicine

## 2018-06-27 ENCOUNTER — Ambulatory Visit: Payer: BLUE CROSS/BLUE SHIELD | Admitting: Family Medicine

## 2018-06-27 ENCOUNTER — Encounter: Payer: Self-pay | Admitting: Family Medicine

## 2018-06-27 VITALS — BP 110/72 | HR 86 | Temp 98.4°F | Resp 18 | Ht 71.0 in | Wt 161.8 lb

## 2018-06-27 DIAGNOSIS — L03211 Cellulitis of face: Secondary | ICD-10-CM | POA: Diagnosis not present

## 2018-06-27 MED ORDER — DOXYCYCLINE HYCLATE 100 MG PO TABS: 100.0000 mg | ORAL_TABLET | Freq: Two times a day (BID) | ORAL | 0 refills | Status: AC

## 2018-06-27 NOTE — Progress Notes (Addendum)
Name: Ricardo Ramos   MRN: [id number]    DOB: December 28, 1995   Date:06/27/2018       Progress Note  Subjective  Chief Complaint  Chief Complaint  Patient presents with  . Eye Problem     right eye swollen, face swollen, sore    HPI  PT presents with swelling and itching just below the right eye that he woke up with this morning.  He denies pain, blurred vision, drainage, fevers, chills, body aches, recent travel, or insect bites that he has noticed.   He does not have an eye doctor that he sees regularly. No recent injury.  Patient Active Problem List   Diagnosis Date Noted  . Acne 06/28/2015  . Anorexia nervosa, restricting type 06/28/2015  . CN (constipation) 06/28/2015  . Allergic contact dermatitis 06/28/2015  . Dermatitis, eczematoid 06/28/2015  . Asthma, moderate persistent 06/28/2015  . Allergic to peanuts 06/28/2015  . Perennial allergic rhinitis with seasonal variation 06/28/2015  . Vitamin D deficiency 06/28/2015  . Allergic conjunctivitis and rhinitis 06/28/2015    Social History   Tobacco Use  . Smoking status: Never Smoker  . Smokeless tobacco: Never Used  Substance Use Topics  . Alcohol use: No    Alcohol/week: 0.0 oz     Current Outpatient Medications:  .  albuterol (PROVENTIL) (2.5 MG/3ML) 0.083% nebulizer solution, Take 3 mLs (2.5 mg total) by nebulization every 6 (six) hours as needed for wheezing or shortness of breath., Disp: 75 mL, Rfl: 0 .  beclomethasone (QVAR REDIHALER) 40 MCG/ACT inhaler, Inhale 2 puffs into the lungs 2 (two) times daily., Disp: 1 Inhaler, Rfl: 0 .  loratadine (CLARITIN) 10 MG tablet, Take 10 mg by mouth daily., Disp: , Rfl:  .  PROAIR HFA 108 (90 Base) MCG/ACT inhaler, INHALE 1-2 PUFFS INTO THE LUNGS EVERY 6 (SIX) HOURS AS NEEDED., Disp: 8.5 Inhaler, Rfl: 0 .  Respiratory Therapy Supplies (NEBULIZER AIR TUBE/PLUGS) MISC, 1 each by Does not apply route daily., Disp: 1 each, Rfl: 2 .  triamcinolone cream (KENALOG) 0.5 %, Apply  topically 2 (two) times daily., Disp: 75 g, Rfl: 0 .  Vitamin D, Ergocalciferol, (DRISDOL) 50000 units CAPS capsule, Take 1 capsule (50,000 Units total) by mouth every 7 (seven) days. (Patient not taking: Reported on 06/27/2018), Disp: 12 capsule, Rfl: 0  Allergies  Allergen Reactions  . Eggs Or Egg-Derived Products   . Fish Allergy   . Peanut Oil   . Peanut-Containing Drug Products   . Penicillins   . Shellfish Allergy   . Strawberry Extract   . Wheat Bran     ROS  Ten systems reviewed and is negative except as mentioned in HPI  Objective  Vitals:   06/27/18 1021  BP: 110/72  Pulse: 86  Resp: 18  Temp: 98.4 F (36.9 C)  TempSrc: Oral  SpO2: 97%  Weight: 161 lb 12.8 oz (73.4 kg)  Height: 5\' 11"  (1.803 m)   Body mass index is 22.57 kg/m.  Nursing Note and Vital Signs reviewed.  Physical Exam  Constitutional: He is oriented to person, place, and time. He appears well-developed and well-nourished.  HENT:  Head: Normocephalic and atraumatic.    Right Ear: External ear normal.  Left Ear: External ear normal.  Nose: Nose normal.  Mouth/Throat: Mucous membranes are normal. No tonsillar exudate.  Area erythematous, with moderate swelling, non-tender without fluctuance noted. No pain with ocular movements.  Eyes: Pupils are equal, round, and reactive to light. Conjunctivae are normal.  Right eye exhibits no exudate. No foreign body present in the right eye. Left eye exhibits no exudate. No foreign body present in the left eye. Right conjunctiva is not injected. Left conjunctiva is not injected. No scleral icterus. Right eye exhibits normal extraocular motion. Left eye exhibits normal extraocular motion.  Neck: Normal range of motion. Neck supple.  Cardiovascular: Normal rate, regular rhythm and normal heart sounds.  Pulmonary/Chest: Effort normal and breath sounds normal.  Musculoskeletal: Normal range of motion. He exhibits no edema.  Neurological: He is alert and oriented  to person, place, and time. No cranial nerve deficit.  Skin: Skin is warm and dry. No rash noted. No erythema.  Psychiatric: He has a normal mood and affect. His behavior is normal. Judgment and thought content normal.  Nursing note and vitals reviewed.   No results found for this or any previous visit (from the past 72 hour(s)).  Assessment & Plan  1. Facial cellulitis - doxycycline (VIBRA-TABS) 100 MG tablet; Take 1 tablet (100 mg total) by mouth 2 (two) times daily for 10 days.  Dispense: 20 tablet; Refill: 0 - Note for work is provided. - Strict return precautions are discussed as per AVS - advised to present to ER if worsening or any red flags as below.  -Red flags and when to present for emergency care or RTC including fever >101.10F, chest pain, shortness of breath, new/worsening/un-resolving symptoms, pain with ocular movement, increased swelling reviewed with patient at time of visit. Follow up and care instructions discussed and provided in AVS.

## 2018-06-27 NOTE — Patient Instructions (Addendum)
If you develop eye pain, pain with eye movements, or worsening swelling, or fever you need to go to the ER right away.  Cellulitis, Adult Cellulitis is a skin infection. The infected area is usually red and sore. This condition occurs most often in the arms and lower legs. It is very important to get treated for this condition. Follow these instructions at home:  Take over-the-counter and prescription medicines only as told by your doctor.  If you were prescribed an antibiotic medicine, take it as told by your doctor. Do not stop taking the antibiotic even if you start to feel better.  Drink enough fluid to keep your pee (urine) clear or pale yellow.  Do not touch or rub the infected area.  Raise (elevate) the infected area above the level of your heart while you are sitting or lying down.  Place warm or cold wet cloths (warm or cold compresses) on the infected area. Do this as told by your doctor.  Keep all follow-up visits as told by your doctor. This is important. These visits let your doctor make sure your infection is not getting worse. Contact a doctor if:  You have a fever.  Your symptoms do not get better after 1-2 days of treatment.  Your bone or joint under the infected area starts to hurt after the skin has healed.  Your infection comes back. This can happen in the same area or another area.  You have a swollen bump in the infected area.  You have new symptoms.  You feel ill and also have muscle aches and pains. Get help right away if:  Your symptoms get worse.  You feel very sleepy.  You throw up (vomit) or have watery poop (diarrhea) for a long time.  There are red streaks coming from the infected area.  Your red area gets larger.  Your red area turns darker. This information is not intended to replace advice given to you by your health care provider. Make sure you discuss any questions you have with your health care provider. Document Released: 05/01/2008  Document Revised: 04/20/2016 Document Reviewed: 09/22/2015 Elsevier Interactive Patient Education  2018 [employer]

## 2020-08-25 ENCOUNTER — Ambulatory Visit: Payer: Self-pay | Admitting: Family Medicine

## 2020-08-25 NOTE — Telephone Encounter (Signed)
Phone call rec'd from pt's. Grandmother.  Reported pt. Is passing blood through urine.  Asked to speak to the patient.  Pt. Stated he had one episode, this AM, that he urinated, and at the end of urination, passed several drops of bright red blood.  Reported the "base" of the penis is sore.  Stated he has bilateral low back pain and bilat. Groin pain that is constant, at time of Triage call.  Reported one episode of urgency to urinate this AM, but was not able to pass urine at that time.  Denied fever/ chills.  Appt. Scheduled at 2:40 PM with PCP.  Unable to reach office by phone at time appt. Was made, due to closed for lunch.  Will send note to office for review.  Pt. Advised to drink water in good amts., take Tylenol for discomfort, and to expect that he may be asked to give a urine sample at time of appt.  Pt. Knows to call back if symptoms worsen.  Verb. Understanding.   Reason for Disposition  [1] Pain or burning with passing urine AND [2] side (flank) or back pain present  Answer Assessment - Initial Assessment Questions 1. COLOR of URINE: "Describe the color of the urine."  (e.g., tea-colored, pink, red, blood clots, bloody)     A little red blood when he urinated 2. ONSET: "When did the bleeding start?"      11:00 AM 3. EPISODES: "How many times has there been blood in the urine?" or "How many times today?"     X 1 4. PAIN with URINATION: "Is there any pain with passing your urine?" If Yes, ask: "How bad is the pain?"  (Scale 1-10; or mild, moderate, severe)    - MILD - complains slightly about urination hurting    - MODERATE - interferes with normal activities      - SEVERE - excruciating, unwilling or unable to urinate because of the pain      Moderate  5. FEVER: "Do you have a fever?" If Yes, ask: "What is your temperature, how was it measured, and when did it start?"     No fever  6. ASSOCIATED SYMPTOMS: "Are you passing urine more frequently than usual?"     This AM had urgency x 1   7. OTHER SYMPTOMS: "Do you have any other symptoms?" (e.g., back/flank pain, abdominal pain, vomiting)     Lower back bilaterally, bilat. Groin areas constant 8. PREGNANCY: "Is there any chance you are pregnant?" "When was your last menstrual period?"     N/a  Protocols used: URINE - BLOOD IN-A-AH

## 2020-08-25 NOTE — Progress Notes (Signed)
Patient ID: Ricardo Ramos, male    DOB: 1996-06-25, 24 y.o.   MRN: [id number]  PCP: Alba Cory, MD  Chief Complaint  Patient presents with  . Hematuria    all symptoms began yesterday 08/25/20  . Groin Pain  . Back Pain    Subjective:   Ricardo Ramos is a 24 y.o. male, presents to clinic with CC of the following:  Chief Complaint  Patient presents with  . Hematuria    all symptoms began yesterday 08/25/20  . Groin Pain  . Back Pain    HPI:  Patient is a 24 year old male patient of Dr. Carlynn Purl Last visit to [facility] was 06/27/2018 The phone call message from yesterday was as follows:  Phone call rec'd from pt's. Grandmother.  Reported pt. Is passing blood through urine.  Asked to speak to the patient.  Pt. Stated he had one episode, this AM, that he urinated, and at the end of urination, passed several drops of bright red blood.  Reported the "base" of the penis is sore.  Stated he has bilateral low back pain and bilat. Groin pain that is constant, at time of Triage call.  Reported one episode of urgency to urinate this AM, but was not able to pass urine at that time.  Denied fever/ chills.  Appt. Scheduled at 2:40 PM with PCP.  Unable to reach office by phone at time appt. Was made, due to closed for lunch.  Will send note to office for review.  Pt. Advised to drink water in good amts., take Tylenol for discomfort, and to expect that he may be asked to give a urine sample at time of appt.  Pt. Knows to call back if symptoms worsen.    Arrangements were made for a follow-up visit today.  He noted that his symptoms started yesterday, with some mild suprapubic pain, and some lower back pains on both sides, and when he went to the bathroom to urinate, he noted a little bit of blood in the urine. Denied any blood oozing from around the distal penis area with no obvious cut evident or source of blood externally. He has had some discomfort with urinating, denies any burning or  stinging when he urinates. He notes that since then, he has not had obvious blood in the urine, notes the low back pain has resolved, although still has some mild discomfort in the suprapubic region, although has significantly lessened. He denied any history of symptoms like this, no history of kidney stones. Denies any fevers, no nausea or vomiting. Did not have flank pains or pains higher up in the back. Denies any pains radiating down the legs. He denied any sexual activities in the past couple months, has no history of STDs, denied any higher risk activities previously. He denies any discharge from the penis, no rash.    Patient Active Problem List   Diagnosis Date Noted  . Acne 06/28/2015  . Anorexia nervosa, restricting type 06/28/2015  . CN (constipation) 06/28/2015  . Allergic contact dermatitis 06/28/2015  . Dermatitis, eczematoid 06/28/2015  . Asthma, moderate persistent 06/28/2015  . Allergic to peanuts 06/28/2015  . Perennial allergic rhinitis with seasonal variation 06/28/2015  . Vitamin D deficiency 06/28/2015  . Allergic conjunctivitis and rhinitis 06/28/2015      Current Outpatient Medications:  .  albuterol (PROVENTIL) (2.5 MG/3ML) 0.083% nebulizer solution, Take 3 mLs (2.5 mg total) by nebulization every 6 (six) hours as needed for wheezing or  shortness of breath., Disp: 75 mL, Rfl: 0 .  beclomethasone (QVAR REDIHALER) 40 MCG/ACT inhaler, Inhale 2 puffs into the lungs 2 (two) times daily., Disp: 1 Inhaler, Rfl: 0 .  loratadine (CLARITIN) 10 MG tablet, Take 10 mg by mouth daily., Disp: , Rfl:  .  PROAIR HFA 108 (90 Base) MCG/ACT inhaler, INHALE 1-2 PUFFS INTO THE LUNGS EVERY 6 (SIX) HOURS AS NEEDED., Disp: 8.5 Inhaler, Rfl: 0 .  Respiratory Therapy Supplies (NEBULIZER AIR TUBE/PLUGS) MISC, 1 each by Does not apply route daily., Disp: 1 each, Rfl: 2   Allergies  Allergen Reactions  . Eggs Or Egg-Derived Products   . Fish Allergy   . Peanut Oil   . Peanut-Containing  Drug Products   . Penicillins   . Shellfish Allergy   . Strawberry Extract   . Wheat Bran      Past Surgical History:  Procedure Laterality Date  . TONSILLECTOMY AND ADENOIDECTOMY       Family History  Problem Relation Age of Onset  . Asthma Sister   . Allergies Sister        Nuts     Social History   Tobacco Use  . Smoking status: Never Smoker  . Smokeless tobacco: Never Used  Substance Use Topics  . Alcohol use: No    Alcohol/week: 0.0 standard drinks    With staff assistance, above reviewed with the patient today.  ROS: As per HPI, otherwise no specific complaints on a limited and focused system review   Results for orders placed or performed in visit on 08/26/20 (from the past 72 hour(s))  POCT Urinalysis Dipstick     Status: Abnormal   Collection Time: 08/26/20  8:46 AM  Result Value Ref Range   Color, UA Yellow    Clarity, UA Clear    Glucose, UA Negative Negative   Bilirubin, UA Negative    Ketones, UA Negative    Spec Grav, UA 1.015 1.010 - 1.025   Blood, UA negative    pH, UA 6.0 5.0 - 8.0   Protein, UA Positive (A) Negative   Urobilinogen, UA 0.2 0.2 or 1.0 E.U./dL   Nitrite, UA Negative    Leukocytes, UA Moderate (2+) (A) Negative   Appearance normal    Odor normal      PHQ2/9: Depression screen [facility] 2/9 08/26/2020 06/27/2018 01/30/2017 06/16/2016 02/16/2016  Decreased Interest 0 0 0 0 0  Down, Depressed, Hopeless 0 0 0 0 0  PHQ - 2 Score 0 0 0 0 0  Altered sleeping - 0 - - -  Tired, decreased energy - 0 - - -  Change in appetite - 0 - - -  Feeling bad or failure about yourself  - 0 - - -  Trouble concentrating - 0 - - -  Moving slowly or fidgety/restless - 0 - - -  Suicidal thoughts - 0 - - -  PHQ-9 Score - 0 - - -  Difficult doing work/chores - Not difficult at all - - -   PHQ-2/9 Result is neg  Fall Risk: Fall Risk  08/26/2020 06/27/2018 01/29/2018 01/30/2017 06/16/2016  Falls in the past year? 0 No No No No  Number falls in past yr: 0 - - -  -  Injury with Fall? 0 - - - -  Follow up Falls evaluation completed - - - -      Objective:   Vitals:   08/26/20 0834  BP: 124/84  Pulse: 93  Resp: 16  Temp:  98 F (36.7 C)  TempSrc: Oral  SpO2: 100%  Weight: 166 lb 6.4 oz (75.5 kg)  Height: 5' 10.5" (1.791 m)    Body mass index is 23.54 kg/m.  Physical Exam   NAD, masked, pleasant, looks well, HEENT - [place]/AT, sclera anicteric, PERRL, conj - non-inj'ed, pharynx clear Neck - supple, no adenopathy, no rigidity Car - RRR without m/g/r, not tachycardic Abd - soft, NT, ND, BS+,  no masses, not markedly tender palpating the suprapubic region where he feels that discomfort centrally Back - no CVA tenderness, nontender to palpate in the lower lumbar muscle regions bilaterally where he noted some discomfort yesterday. Nontender with palpation over the lumbar spine. GU-no urethral discharge, no abrasion or rash evident on the penis, no inguinal adenopathy, nontender to palpate the scrotal/testicle regions, no lumps identified Skin- no rash noted on exposed areas Neuro/psychiatric - affect was not flat, appropriate with conversation  Alert and oriented  Grossly non-focal   Speech normal   Results for orders placed or performed in visit on 08/26/20  POCT Urinalysis Dipstick  Result Value Ref Range   Color, UA Yellow    Clarity, UA Clear    Glucose, UA Negative Negative   Bilirubin, UA Negative    Ketones, UA Negative    Spec Grav, UA 1.015 1.010 - 1.025   Blood, UA negative    pH, UA 6.0 5.0 - 8.0   Protein, UA Positive (A) Negative   Urobilinogen, UA 0.2 0.2 or 1.0 E.U./dL   Nitrite, UA Negative    Leukocytes, UA Moderate (2+) (A) Negative   Appearance normal    Odor normal    UA dip-positive protein, moderate leukocytes, otherwise negative    Assessment & Plan:    1. Hematuria, unspecified type/2.suprapubic abdominal pain Exact source unclear presently, with his urine dip in the office showing no blood, and negative  for nitrite.  Did have moderate leukocytes, and positive protein.  Question some mild discomfort with urination, although not marked, and his suprapubic pain has improved.  He had no flank pains, did have some lower back pain yesterday which has resolved.  Really doubt a kidney stone concern. Felt best to send for a UA and culture before starting any antibiotic for possible UTI concern He has not been sexually active in the recent past, has no history of STDs nor high risk behavior, no recent discharge or rash symptoms of concern and doubt an STD concern.  - POCT Urinalysis Dipstick - Urine Culture - Urinalysis, Complete  Noted could be a mild urethritis, or inflammatory, with possible urethral irritation that caused the concern yesterday, and await the work-up above presently  3. Acute bilateral low back pain without sciatica The back pain noted yesterday was lower back, bilateral, and no CVA tenderness noted on exam today.  That pain has resolved.  Recommended the following to the patient today: Will send the urine for a urinalysis and culture and await those results. Stay well-hydrated Can use a Tylenol product (acetaminophen) as needed for any discomfort that persists.  We will follow-up when the results have returned, and patient is to follow-up if symptoms increasing or more concerning is awaiting those results.  Did note it is likely not until Monday until the results will have returned.  Jamelle Haring, MD 08/26/20 9:00 AM

## 2020-08-25 NOTE — Telephone Encounter (Signed)
Dr Carlynn Purl will not be in the office during that time. We had that slot on hold. I did contact the patient and rescheduled him with Dr Dorris Fetch for tomorrow. Pt verbalized understanding.

## 2020-08-26 ENCOUNTER — Ambulatory Visit (INDEPENDENT_AMBULATORY_CARE_PROVIDER_SITE_OTHER): Payer: BC Managed Care – PPO | Admitting: Internal Medicine

## 2020-08-26 ENCOUNTER — Encounter: Payer: Self-pay | Admitting: Internal Medicine

## 2020-08-26 ENCOUNTER — Other Ambulatory Visit: Payer: Self-pay

## 2020-08-26 VITALS — BP 124/84 | HR 93 | Temp 98.0°F | Resp 16 | Ht 70.5 in | Wt 166.4 lb

## 2020-08-26 DIAGNOSIS — R102 Pelvic and perineal pain: Secondary | ICD-10-CM

## 2020-08-26 DIAGNOSIS — R319 Hematuria, unspecified: Secondary | ICD-10-CM

## 2020-08-26 DIAGNOSIS — R1024 Suprapubic pain: Secondary | ICD-10-CM

## 2020-08-26 DIAGNOSIS — M545 Low back pain, unspecified: Secondary | ICD-10-CM

## 2020-08-26 LAB — POCT URINALYSIS DIPSTICK
Appearance: NORMAL
Bilirubin, UA: NEGATIVE
Blood, UA: NEGATIVE
Glucose, UA: NEGATIVE
Ketones, UA: NEGATIVE
Nitrite, UA: NEGATIVE
Odor: NORMAL
Protein, UA: POSITIVE — AB
Spec Grav, UA: 1.015 (ref 1.010–1.025)
Urobilinogen, UA: 0.2 E.U./dL
pH, UA: 6 (ref 5.0–8.0)

## 2020-08-26 NOTE — Patient Instructions (Signed)
Will send the urine for a urinalysis and culture and await those results.  Stay well-hydrated  Can use a Tylenol product (acetaminophen) as needed for any discomfort that persists.

## 2020-08-27 ENCOUNTER — Other Ambulatory Visit: Payer: Self-pay | Admitting: Internal Medicine

## 2020-08-27 DIAGNOSIS — N3001 Acute cystitis with hematuria: Secondary | ICD-10-CM

## 2020-08-27 MED ORDER — SULFAMETHOXAZOLE-TRIMETHOPRIM 800-160 MG PO TABS: 1.0000 | ORAL_TABLET | Freq: Two times a day (BID) | ORAL | 0 refills | Status: AC

## 2020-08-27 NOTE — Progress Notes (Signed)
Noted concerns with the urinalysis for possible infection and felt an antibiotic was best to start as we await the culture results over the weekend. Bactrim DS-1 twice daily was prescribed Awaiting culture results presently.

## 2020-08-27 NOTE — Progress Notes (Signed)
Called patient, I was informed he is at work an unable to talk. The person on the phone said she will have him call when he gets off work today for lab results.

## 2020-08-28 LAB — URINALYSIS, COMPLETE
Bilirubin Urine: NEGATIVE
Glucose, UA: NEGATIVE
Hgb urine dipstick: NEGATIVE
Hyaline Cast: NONE SEEN /LPF
Ketones, ur: NEGATIVE
Nitrite: POSITIVE — AB
Protein, ur: NEGATIVE
RBC / HPF: NONE SEEN /HPF (ref 0–2)
Specific Gravity, Urine: 1.021 (ref 1.001–1.03)
Squamous Epithelial / LPF: NONE SEEN /HPF (ref <=5)
WBC, UA: >=60 /HPF — AB (ref 0–5)
pH: 5.5 (ref 5.0–8.0)

## 2020-08-28 LAB — URINE CULTURE
MICRO NUMBER:: 11016901
SPECIMEN QUALITY:: ADEQUATE

## 2020-08-29 ENCOUNTER — Telehealth: Payer: Self-pay | Admitting: Internal Medicine

## 2020-08-29 NOTE — Telephone Encounter (Signed)
The pateint's urine culture returned positive and I tried to contact him on the mobile phone number listed at about 1 pm Sunday 08/29/20 as was not clear if Ricardo Ramos was able to contact him on Friday to have him start the antibiotic recommended after his urinalysis was reviewed, and I was informed by a recording that he is unable to receive calls at this time.

## 2020-08-31 NOTE — Telephone Encounter (Signed)
Patient notified

## 2021-08-15 ENCOUNTER — Other Ambulatory Visit: Payer: Self-pay

## 2021-08-15 ENCOUNTER — Encounter: Payer: Self-pay | Admitting: Family Medicine

## 2021-08-15 ENCOUNTER — Ambulatory Visit (INDEPENDENT_AMBULATORY_CARE_PROVIDER_SITE_OTHER): Payer: BC Managed Care – PPO | Admitting: Family Medicine

## 2021-08-15 VITALS — BP 118/82 | HR 91 | Temp 98.0°F | Resp 16 | Ht 71.0 in | Wt 159.0 lb

## 2021-08-15 DIAGNOSIS — L2082 Flexural eczema: Secondary | ICD-10-CM

## 2021-08-15 MED ORDER — TRIAMCINOLONE ACETONIDE 0.025 % EX OINT: 1.0000 "application " | TOPICAL_OINTMENT | Freq: Two times a day (BID) | CUTANEOUS | 3 refills | Status: DC

## 2021-08-15 MED ORDER — PREDNISONE 20 MG PO TABS: 40.0000 mg | ORAL_TABLET | Freq: Every day | ORAL | 0 refills | Status: AC

## 2021-08-15 NOTE — Patient Instructions (Addendum)
It was great to see you!  Our plans for today:  - Take the steroid as prescribed.  - I sent a refill for your triamcinolone to the pharmacy.  - Make an appointment for an annual physical at your convenience.  Take care and seek immediate care sooner if you develop any concerns.   Dr. Linwood Dibbles

## 2021-08-15 NOTE — Assessment & Plan Note (Signed)
Uncontrolled, in setting of being out of steroid cream for few years. Given generalized nature, will rx steroid burst. Refill provided of triamcinolone ointment. If symptoms still persistent after oral steroids, recommend reevaluation. May be good candidate for eucrisa if having to use topical steroid frequently.

## 2021-08-15 NOTE — Progress Notes (Signed)
    SUBJECTIVE:   CHIEF COMPLAINT / HPI:   RASH Duration:  chronic  Location: trunk, face, and legs  Itching: yes Burning: yes Redness: yes Oozing: yes Scaling: yes Blisters: yes Painful: yes Fevers: no Change in detergents/soaps/personal care products: no Recent illness: no Recent travel:no History of same: yes Context: worse Alleviating factors: hydrocortisone cream helps some Treatments attempted: hydrocortisone cream, gold bond cream Shortness of breath: no  Throat/tongue swelling: no Myalgias/arthralgias: no   OBJECTIVE:   BP 118/82   Pulse 91   Temp 98 F (36.7 C)   Resp 16   Ht 5\' 11"  (1.803 m)   Wt 159 lb (72.1 kg)   SpO2 99%   BMI 22.18 kg/m   Gen: well appearing, in NAD Skin: diffuse, generalized dry hyperkeratotic rash to trunk, b/l legs and arms with few excoriations to dorsum of feet.   ASSESSMENT/PLAN:   Dermatitis, eczematoid Uncontrolled, in setting of being out of steroid cream for few years. Given generalized nature, will rx steroid burst. Refill provided of triamcinolone ointment. If symptoms still persistent after oral steroids, recommend reevaluation. May be good candidate for eucrisa if having to use topical steroid frequently.     , DO

## 2021-08-30 ENCOUNTER — Other Ambulatory Visit: Payer: Self-pay

## 2021-08-30 ENCOUNTER — Ambulatory Visit (INDEPENDENT_AMBULATORY_CARE_PROVIDER_SITE_OTHER): Payer: BC Managed Care – PPO | Admitting: Family Medicine

## 2021-08-30 ENCOUNTER — Encounter: Payer: Self-pay | Admitting: Family Medicine

## 2021-08-30 VITALS — BP 124/74 | HR 94 | Temp 97.8°F | Resp 16 | Ht 71.0 in | Wt 162.7 lb

## 2021-08-30 DIAGNOSIS — L2082 Flexural eczema: Secondary | ICD-10-CM | POA: Diagnosis not present

## 2021-08-30 MED ORDER — TRIAMCINOLONE ACETONIDE 0.05 % EX OINT: 1.0000 "application " | TOPICAL_OINTMENT | Freq: Two times a day (BID) | CUTANEOUS | 0 refills | Status: AC

## 2021-08-30 MED ORDER — TRIAMCINOLONE ACETONIDE 0.5 % EX OINT: 1.0000 "application " | TOPICAL_OINTMENT | Freq: Two times a day (BID) | CUTANEOUS | 3 refills | Status: DC

## 2021-08-30 NOTE — Patient Instructions (Signed)
It was great to see you!  Our plans for today:  - Use vaseline or aquaphor to help keep your skin moisturized.  - We sent a higher potency of the triamcinolone to your pharmacy.   Take care and seek immediate care sooner if you develop any concerns.   Dr. Linwood Dibbles

## 2021-08-30 NOTE — Assessment & Plan Note (Signed)
Improved but persistent. Will increase steroid potency. Refer to derm, would likely be a good candidate for biologic. F/u prn.

## 2021-08-30 NOTE — Progress Notes (Signed)
   SUBJECTIVE:   CHIEF COMPLAINT / HPI:   Eczema - seen previously 9/19 for generalized eczema flare. Rx steroid burst and refilled triamcinolone. - got better with steroids, then came back.  - using triamicinolone daily - typical triggers: environmental triggers.  - no pets.  - previously followed with Derm, has been some years.  OBJECTIVE:   BP 124/74   Pulse 94   Temp 97.8 F (36.6 C) (Oral)   Resp 16   Ht 5\' 11"  (1.803 m)   Wt 162 lb 11.2 oz (73.8 kg)   SpO2 99%   BMI 22.69 kg/m   Gen: well appearing, in NAD Skin: diffuse dry, hyperpigmented patches to trunk, b/l legs. Few excoriations. No signs of superimposed infection.   ASSESSMENT/PLAN:   Dermatitis, eczematoid Improved but persistent. Will increase steroid potency. Refer to derm, would likely be a good candidate for biologic. F/u prn.      , DO

## 2022-02-01 ENCOUNTER — Ambulatory Visit: Payer: Self-pay | Admitting: Dermatology
